# Patient Record
Sex: Male | Born: 1940 | Race: White | Hispanic: No | Marital: Married | Smoking: Former smoker
Health system: Southern US, Community
[De-identification: ages and names within clinical notes are randomized; demographics above are authoritative.]

## PROBLEM LIST (undated history)

## (undated) DIAGNOSIS — Z9079 Acquired absence of other genital organ(s): Secondary | ICD-10-CM

## (undated) DIAGNOSIS — Z9889 Other specified postprocedural states: Secondary | ICD-10-CM

## (undated) DIAGNOSIS — I10 Essential (primary) hypertension: Secondary | ICD-10-CM

## (undated) DIAGNOSIS — R569 Unspecified convulsions: Secondary | ICD-10-CM

## (undated) DIAGNOSIS — C801 Malignant (primary) neoplasm, unspecified: Secondary | ICD-10-CM

---

## 1974-01-14 HISTORY — PX: COLONOSCOPY: SHX174

## 2010-01-14 HISTORY — PX: PROSTATE SURGERY: SHX751

## 2020-01-15 DEATH — deceased

## 2021-06-13 ENCOUNTER — Emergency Department (HOSPITAL_COMMUNITY): Payer: Medicare Other

## 2021-06-13 ENCOUNTER — Inpatient Hospital Stay (HOSPITAL_COMMUNITY)
Admission: EM | Admit: 2021-06-13 | Discharge: 2021-06-15 | DRG: 066 | Disposition: A | Discharge status: Home / Self Care | Payer: Medicare Other | Attending: Internal Medicine | Admitting: Internal Medicine

## 2021-06-13 ENCOUNTER — Encounter (HOSPITAL_COMMUNITY): Payer: Self-pay

## 2021-06-13 DIAGNOSIS — G40909 Epilepsy, unspecified, not intractable, without status epilepticus: Secondary | ICD-10-CM | POA: Diagnosis present

## 2021-06-13 DIAGNOSIS — R29702 NIHSS score 2: Secondary | ICD-10-CM | POA: Diagnosis present

## 2021-06-13 DIAGNOSIS — I63412 Cerebral infarction due to embolism of left middle cerebral artery: Secondary | ICD-10-CM

## 2021-06-13 DIAGNOSIS — R4781 Slurred speech: Secondary | ICD-10-CM | POA: Diagnosis present

## 2021-06-13 DIAGNOSIS — N179 Acute kidney failure, unspecified: Secondary | ICD-10-CM | POA: Diagnosis present

## 2021-06-13 DIAGNOSIS — I63132 Cerebral infarction due to embolism of left carotid artery: Secondary | ICD-10-CM | POA: Diagnosis not present

## 2021-06-13 DIAGNOSIS — Z8546 Personal history of malignant neoplasm of prostate: Secondary | ICD-10-CM

## 2021-06-13 DIAGNOSIS — Z9079 Acquired absence of other genital organ(s): Secondary | ICD-10-CM

## 2021-06-13 DIAGNOSIS — R4701 Aphasia: Secondary | ICD-10-CM | POA: Diagnosis present

## 2021-06-13 DIAGNOSIS — I639 Cerebral infarction, unspecified: Secondary | ICD-10-CM | POA: Insufficient documentation

## 2021-06-13 DIAGNOSIS — I10 Essential (primary) hypertension: Secondary | ICD-10-CM | POA: Diagnosis present

## 2021-06-13 DIAGNOSIS — Z20822 Contact with and (suspected) exposure to covid-19: Secondary | ICD-10-CM | POA: Diagnosis present

## 2021-06-13 DIAGNOSIS — R1310 Dysphagia, unspecified: Secondary | ICD-10-CM | POA: Diagnosis present

## 2021-06-13 DIAGNOSIS — R569 Unspecified convulsions: Secondary | ICD-10-CM

## 2021-06-13 DIAGNOSIS — E785 Hyperlipidemia, unspecified: Secondary | ICD-10-CM | POA: Diagnosis present

## 2021-06-13 DIAGNOSIS — Z888 Allergy status to other drugs, medicaments and biological substances status: Secondary | ICD-10-CM

## 2021-06-13 DIAGNOSIS — Z79899 Other long term (current) drug therapy: Secondary | ICD-10-CM

## 2021-06-13 HISTORY — DX: Acquired absence of other genital organ(s): Z90.79

## 2021-06-13 HISTORY — DX: Malignant (primary) neoplasm, unspecified: C80.1

## 2021-06-13 HISTORY — DX: Other specified postprocedural states: Z98.890

## 2021-06-13 HISTORY — DX: Essential (primary) hypertension: I10

## 2021-06-13 HISTORY — DX: Unspecified convulsions: R56.9

## 2021-06-13 LAB — COMPREHENSIVE METABOLIC PANEL
ALT: 15 U/L (ref 0–44)
AST: 20 U/L (ref 15–41)
Albumin: 3.6 g/dL (ref 3.5–5.0)
Alkaline Phosphatase: 63 U/L (ref 38–126)
Anion gap: 8 (ref 5–15)
BUN: 20 mg/dL (ref 8–23)
CO2: 22 mmol/L (ref 22–32)
Calcium: 8.6 mg/dL — ABNORMAL LOW (ref 8.9–10.3)
Chloride: 105 mmol/L (ref 98–111)
Creatinine, Ser: 1.39 mg/dL — ABNORMAL HIGH (ref 0.61–1.24)
GFR, Estimated: 51 mL/min — ABNORMAL LOW (ref >60)
Glucose, Bld: 108 mg/dL — ABNORMAL HIGH (ref 70–99)
Potassium: 4.2 mmol/L (ref 3.5–5.1)
Sodium: 135 mmol/L (ref 135–145)
Total Bilirubin: 0.4 mg/dL (ref 0.3–1.2)
Total Protein: 6.6 g/dL (ref 6.5–8.1)

## 2021-06-13 LAB — I-STAT CHEM 8, ED
BUN: 24 mg/dL — ABNORMAL HIGH (ref 8–23)
Calcium, Ion: 1.01 mmol/L — ABNORMAL LOW (ref 1.15–1.40)
Chloride: 105 mmol/L (ref 98–111)
Creatinine, Ser: 1.3 mg/dL — ABNORMAL HIGH (ref 0.61–1.24)
Glucose, Bld: 101 mg/dL — ABNORMAL HIGH (ref 70–99)
HCT: 36 % — ABNORMAL LOW (ref 39.0–52.0)
Hemoglobin: 12.2 g/dL — ABNORMAL LOW (ref 13.0–17.0)
Potassium: 4.1 mmol/L (ref 3.5–5.1)
Sodium: 136 mmol/L (ref 135–145)
TCO2: 22 mmol/L (ref 22–32)

## 2021-06-13 LAB — CBC
HCT: 37.4 % — ABNORMAL LOW (ref 39.0–52.0)
Hemoglobin: 12.1 g/dL — ABNORMAL LOW (ref 13.0–17.0)
MCH: 28.9 pg (ref 26.0–34.0)
MCHC: 32.4 g/dL (ref 30.0–36.0)
MCV: 89.5 fL (ref 80.0–100.0)
Platelets: 212 10*3/uL (ref 150–400)
RBC: 4.18 MIL/uL — ABNORMAL LOW (ref 4.22–5.81)
RDW: 14.6 % (ref 11.5–15.5)
WBC: 7.7 10*3/uL (ref 4.0–10.5)
nRBC: 0 % (ref 0.0–0.2)

## 2021-06-13 LAB — URINALYSIS, ROUTINE W REFLEX MICROSCOPIC
Bilirubin Urine: NEGATIVE
Glucose, UA: NEGATIVE mg/dL
Ketones, ur: NEGATIVE mg/dL
Leukocytes,Ua: NEGATIVE
Nitrite: NEGATIVE
Protein, ur: NEGATIVE mg/dL
Specific Gravity, Urine: 1.02 (ref 1.005–1.030)
pH: 6 (ref 5.0–8.0)

## 2021-06-13 LAB — RAPID URINE DRUG SCREEN, HOSP PERFORMED
Amphetamines: NOT DETECTED
Barbiturates: NOT DETECTED
Benzodiazepines: NOT DETECTED
Cocaine: NOT DETECTED
Opiates: NOT DETECTED
Tetrahydrocannabinol: NOT DETECTED

## 2021-06-13 LAB — HEMOGLOBIN A1C
Hgb A1c MFr Bld: 5.9 % — ABNORMAL HIGH (ref 4.8–5.6)
Mean Plasma Glucose: 122.63 mg/dL

## 2021-06-13 LAB — DIFFERENTIAL
Abs Immature Granulocytes: 0.03 10*3/uL (ref 0.00–0.07)
Basophils Absolute: 0.1 10*3/uL (ref 0.0–0.1)
Basophils Relative: 1 %
Eosinophils Absolute: 0.3 10*3/uL (ref 0.0–0.5)
Eosinophils Relative: 4 %
Immature Granulocytes: 0 %
Lymphocytes Relative: 31 %
Lymphs Abs: 2.4 10*3/uL (ref 0.7–4.0)
Monocytes Absolute: 0.6 10*3/uL (ref 0.1–1.0)
Monocytes Relative: 7 %
Neutro Abs: 4.3 10*3/uL (ref 1.7–7.7)
Neutrophils Relative %: 57 %

## 2021-06-13 LAB — RESP PANEL BY RT-PCR (FLU A&B, COVID) ARPGX2
Influenza A by PCR: NEGATIVE
Influenza B by PCR: NEGATIVE
SARS Coronavirus 2 by RT PCR: NEGATIVE

## 2021-06-13 LAB — APTT: aPTT: 33 seconds (ref 24–36)

## 2021-06-13 LAB — CBG MONITORING, ED: Glucose-Capillary: 106 mg/dL — ABNORMAL HIGH (ref 70–99)

## 2021-06-13 LAB — PROTIME-INR
INR: 1 (ref 0.8–1.2)
Prothrombin Time: 13.3 seconds (ref 11.4–15.2)

## 2021-06-13 MED ORDER — ATORVASTATIN CALCIUM 40 MG PO TABS: 40.0000 mg | ORAL_TABLET | Freq: Every day | ORAL | Status: DC

## 2021-06-13 MED ORDER — SODIUM CHLORIDE 0.9 % IV BOLUS
500.0000 mL | Freq: Once | INTRAVENOUS | Status: AC
  Administered 2021-06-13: 500 mL via INTRAVENOUS

## 2021-06-13 MED ORDER — LORAZEPAM 0.5 MG PO TABS: 0.5000 mg | ORAL_TABLET | Freq: Four times a day (QID) | ORAL | Status: DC | PRN

## 2021-06-13 MED ORDER — ACETAMINOPHEN 160 MG/5ML PO SOLN: 650.0000 mg | ORAL | Status: DC | PRN

## 2021-06-13 MED ORDER — HYDRALAZINE HCL 25 MG PO TABS
25.0000 mg | ORAL_TABLET | Freq: Four times a day (QID) | ORAL | Status: DC | PRN
  Administered 2021-06-13: 25 mg via ORAL
  Filled 2021-06-13: qty 1

## 2021-06-13 MED ORDER — ASPIRIN 81 MG PO TBEC
81.0000 mg | DELAYED_RELEASE_TABLET | Freq: Every day | ORAL | Status: DC
  Administered 2021-06-13: 81 mg via ORAL
  Filled 2021-06-13: qty 1

## 2021-06-13 MED ORDER — ENOXAPARIN SODIUM 40 MG/0.4ML IJ SOSY
40.0000 mg | PREFILLED_SYRINGE | INTRAMUSCULAR | Status: DC
  Administered 2021-06-13 – 2021-06-14 (×2): 40 mg via SUBCUTANEOUS
  Filled 2021-06-13 (×2): qty 0.4

## 2021-06-13 MED ORDER — ACETAMINOPHEN 650 MG RE SUPP: 650.0000 mg | RECTAL | Status: DC | PRN

## 2021-06-13 MED ORDER — LEVETIRACETAM 750 MG PO TABS
750.0000 mg | ORAL_TABLET | Freq: Two times a day (BID) | ORAL | Status: DC
  Administered 2021-06-13 – 2021-06-15 (×4): 750 mg via ORAL
  Filled 2021-06-13 (×5): qty 1

## 2021-06-13 MED ORDER — HYDRALAZINE HCL 20 MG/ML IJ SOLN
10.0000 mg | INTRAMUSCULAR | Status: AC
  Administered 2021-06-13: 10 mg via INTRAVENOUS
  Filled 2021-06-13: qty 1

## 2021-06-13 MED ORDER — ACETAMINOPHEN 325 MG PO TABS: 650.0000 mg | ORAL_TABLET | ORAL | Status: DC | PRN

## 2021-06-13 MED ORDER — LORAZEPAM 1 MG PO TABS
1.0000 mg | ORAL_TABLET | Freq: Once | ORAL | Status: AC | PRN
  Administered 2021-06-13: 1 mg via ORAL
  Filled 2021-06-13: qty 1

## 2021-06-13 MED ORDER — IOHEXOL 350 MG/ML SOLN
100.0000 mL | Freq: Once | INTRAVENOUS | Status: AC | PRN
  Administered 2021-06-13: 100 mL via INTRAVENOUS

## 2021-06-13 MED ORDER — SODIUM CHLORIDE 0.9 % IV SOLN
100.0000 mL/h | INTRAVENOUS | Status: DC
  Administered 2021-06-13 – 2021-06-15 (×4): 100 mL/h via INTRAVENOUS

## 2021-06-13 MED ORDER — STROKE: EARLY STAGES OF RECOVERY BOOK
Freq: Once | Status: AC
  Filled 2021-06-13: qty 1

## 2021-06-13 MED ORDER — SENNOSIDES-DOCUSATE SODIUM 8.6-50 MG PO TABS: 1.0000 | ORAL_TABLET | Freq: Every evening | ORAL | Status: DC | PRN

## 2021-06-13 MED ORDER — PANTOPRAZOLE SODIUM 40 MG PO TBEC
40.0000 mg | DELAYED_RELEASE_TABLET | Freq: Every day | ORAL | Status: DC
  Administered 2021-06-13 – 2021-06-15 (×3): 40 mg via ORAL
  Filled 2021-06-13 (×3): qty 1

## 2021-06-13 NOTE — ED Triage Notes (Signed)
LKN last night at 2030-2130. Pt is here for slurred speech, unable to state his name, birthday. Able to follow commands. No weakness noted to all four extremities. No hx of stroke. Not on blood thinners.

## 2021-06-13 NOTE — ED Notes (Signed)
Dr.Lockwood notified of pt bp. Pt reports he took his morning bp meds. Will continue to monitor.

## 2021-06-13 NOTE — ED Notes (Signed)
Wife reports pt LKN at 2030 last night when pt was unable to change the channel of tv and was letting his wife do it. This morning, pt unable to state his name, birthday or unable to identify things at home. Wife states pt speech is different and slurred with aphasia noted. Initial NIHS is 4. Pt is able to follow commands. Not on blood thinners. No hx of stroke but pt has hx of seizures noted. Cardiac monitoring in place.

## 2021-06-13 NOTE — ED Provider Notes (Signed)
[facility] EMERGENCY DEPARTMENT Provider Note   CSN: [id number] Arrival date & time: 06/13/21  1532     History  Chief Complaint  Patient presents with   Aphasia    Jay Howard is a 81 y.o. male.  HPI Patient presents with his wife who provides much of the history as the patient has gross evidence for expressive aphasia, level 5 caveat. Patient has history of hypertension, no history of stroke, A-fib, CAD.  He was in his usual state of health until about 20 hours ago when he developed expressive aphasia.  This has persisted since that time without development of new other weakness or complaints including pain, dyspnea.  No clear alleviating or exacerbating factors.    Home Medications Prior to Admission medications   Not on File      Allergies    Amlodipine, Hydrochlorothiazide, Lisinopril, and Quinapril    Review of Systems   Review of Systems  All other systems reviewed and are negative.  Physical Exam Updated Vital Signs BP (!) 162/95   Pulse 65   Temp 98 F (36.7 C) (Oral)   Resp (!) 22   Ht '5\' 7"'$  (1.702 m)   Wt 77.1 kg   SpO2 95%   BMI 26.63 kg/m  Physical Exam Vitals and nursing note reviewed.  Constitutional:      General: He is not in acute distress.    Appearance: He is well-developed.  HENT:     Head: Normocephalic and atraumatic.  Eyes:     Conjunctiva/sclera: Conjunctivae normal.  Cardiovascular:     Rate and Rhythm: Normal rate and regular rhythm.  Pulmonary:     Effort: Pulmonary effort is normal. No respiratory distress.     Breath sounds: No stridor.  Abdominal:     General: There is no distension.  Skin:    General: Skin is warm and dry.  Neurological:     Mental Status: He is alert and oriented to person, place, and time.     Cranial Nerves: Dysarthria present. No facial asymmetry.     Motor: No weakness, tremor, atrophy or abnormal muscle tone.     Coordination: Coordination normal.    ED Results /  Procedures / Treatments   Labs (all labs ordered are listed, but only abnormal results are displayed) Labs Reviewed  CBC - Abnormal; Notable for the following components:      Result Value   RBC 4.18 (*)    Hemoglobin 12.1 (*)    HCT 37.4 (*)    All other components within normal limits  CBG MONITORING, ED - Abnormal; Notable for the following components:   Glucose-Capillary 106 (*)    All other components within normal limits  I-STAT CHEM 8, ED - Abnormal; Notable for the following components:   BUN 24 (*)    Creatinine, Ser 1.30 (*)    Glucose, Bld 101 (*)    Calcium, Ion 1.01 (*)    Hemoglobin 12.2 (*)    HCT 36.0 (*)    All other components within normal limits  RESP PANEL BY RT-PCR (FLU A&B, COVID) ARPGX2  PROTIME-INR  APTT  DIFFERENTIAL  COMPREHENSIVE METABOLIC PANEL  RAPID URINE DRUG SCREEN, HOSP PERFORMED  URINALYSIS, ROUTINE W REFLEX MICROSCOPIC    EKG EKG Interpretation  Date/Time:  Wednesday Jun 13 2021 15:34:39 EDT Ventricular Rate:  71 PR Interval:  242 QRS Duration: 97 QT Interval:  413 QTC Calculation: 449 R Axis:   79 Text Interpretation: Sinus rhythm Prolonged  PR interval Abnormal ECG Confirmed by Carmin Muskrat [id number]) on 06/13/2021 4:13:06 PM  Radiology No results found.  Procedures Procedures    Medications Ordered in ED Medications  sodium chloride 0.9 % bolus 500 mL (500 mLs Intravenous New Bag/Given 06/13/21 1654)    Followed by  0.9 %  sodium chloride infusion (100 mL/hr Intravenous New Bag/Given 06/13/21 1654)  iohexol (OMNIPAQUE) 350 MG/ML injection 100 mL (100 mLs Intravenous Contrast Given 06/13/21 1650)    ED Course/ Medical Decision Making/ A&P This patient with a Hx of hypertension presents to the ED for concern of new expressive aphasia, this involves an extensive number of treatment options, and is a complaint that carries with it a high risk of complications and morbidity.    The differential diagnosis includes stroke,  hypertensive emergency, stenosis   Social Determinants of Health:  Age  Additional history obtained:  Additional history and/or information obtained from wife, notable for details of history as above   After the initial evaluation, orders, including: Labs CT CT perfusion CT a head and neck after discussion with neurology were initiated.  Following my initial exam given the passage of almost 24 hours the patient not a candidate for TNK, but given concern for new focal neurologic deficit I discussed this case with our neurology colleagues and the patient was designated as a code stroke.   Patient placed on Cardiac and Pulse-Oximetry Monitors. The patient was maintained on a cardiac monitor.  The cardiac monitored showed an rhythm of 70 sinus normal The patient was also maintained on pulse oximetry. The readings were typically 100% room air normal   On repeat evaluation of the patient stayed the same  Lab Tests:  I personally interpreted labs.  The pertinent results include: Mild anemia, mild hyperglycemia  Imaging Studies ordered:  I independently visualized and interpreted imaging which showed likely calcific plaque, M2, left, consistent with patient's neuro deficit.  I reviewed these images with the neurologist at length. I agree with the radiologist interpretation  Consultations Obtained:  I requested consultation with the neurology and internal medicine,  and discussed lab and imaging findings as well as pertinent plan - they recommend: Admission for stroke evaluation.  Dispostion / Final MDM:  After consideration of the diagnostic results and the patient's response to treatment, this adult male with history of hypertension presents with expressive aphasia for almost 24 hours.  Patient is awake, alert, otherwise has a reassuring neurologic exam, is in no distress, has mild hypertension, but is otherwise hemodynamically unremarkable. Given concern for new neurodeficit the  patient's condition, he is designated as a code stroke, and expeditious evaluation demonstrated CT findings concerning for acute occlusion left M2, which was discussed at length with our neurology colleagues, family aware, patient required admission to our internal medicine colleagues.  Final Clinical Impression(s) / ED Diagnoses Final diagnoses:  Acute CVA (cerebrovascular accident) [facility])     Carmin Muskrat, MD 06/13/21 [phone]

## 2021-06-13 NOTE — ED Notes (Signed)
Per neuro, neuro checks q1hour until 2030 tonight. Orders in.

## 2021-06-13 NOTE — Consult Note (Signed)
Neurology Consultation  Reason for Consult: CODE STROKE - aphasia Referring Physician: Dr. Carmin Muskrat  CC: Aphasia  History is obtained from: Chart, EDP, wife  HPI: Jay Howard is a 81 y.o. 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  Last known well was somewhere around 8 or 8:30 PM last night when he was watching TV and suddenly was noted that he was not able to operate the remote control and had difficulty with communication.  Not sure why the family waited so long to get him into the hospital.  Brought in by EMS-code stroke was not activated because it was isolated aphasia and no other features that would prompt activation of code stroke based on Van scale EDP evaluated the patient and contacted me and I recommended we activate a code stroke to get prompt imaging. CT head with established looking hypodensity in the left frontal lobe consistent with an acute infarction with no evidence of bleed.  Calcific embolus in the left sylvian fissure.  Emergent CT angiography and perfusion studies were performed that showed a calcific embolus in the left M2 segment superior division. Outside the window for TNKase Low NIH stroke scale as well as distal location of the clot with a calcific clot making endovascular thrombectomy risky for low NIH stroke scale.   LKW: 8:30 PM on 06/12/2021 tpa given?: no, outside the window Premorbid modified Rankin scale (mRS): 1   ROS: Unable to perform due to aphasia  Past Medical History:  Diagnosis Date   Cancer [facility])    H/O hemorrhoidectomy    H/O prostatectomy    Hypertension    Seizures ([place])    No family history on file.  Social History:   reports that he has never smoked. He has never used smokeless tobacco. He reports current alcohol use. He reports that he does not use drugs.  Medications  Current  Facility-Administered Medications:    sodium chloride 0.9 % bolus 500 mL, 500 mL, Intravenous, Once **FOLLOWED BY** 0.9 %  sodium chloride infusion, 100 mL/hr, Intravenous, Continuous, Carmin Muskrat, MD No current outpatient medications on file.  Exam: Current vital signs: BP (!) 187/78 (BP Location: Right Arm)   Pulse 66   Temp 98 F (36.7 C) (Oral)   Resp 15   Ht '5\' 7"'$  (1.702 m)   Wt 77.1 kg   SpO2 99%   BMI 26.63 kg/m  Vital signs in last 24 hours: Temp:  [98 F (36.7 C)] 98 F (36.7 C) (05/31 1537) Pulse Rate:  [66] 66 (05/31 1537) Resp:  [15] 15 (05/31 1537) BP: (187)/(78) 187/78 (05/31 1537) SpO2:  [99 %] 99 % (05/31 1537) Weight:  [77.1 kg] 77.1 kg (05/31 1537) General: Awake alert in no distress HEENT: Normocephalic atraumatic Lungs: Clear Cardiovascular: Regular rhythm Abdomen nondistended nontender Extremities warm well perfused Neurological exam He is awake alert. He was not able to tell me his name. He is not able to tell me where he is He was not able to tell me the date or month. Upon asking him his name he kept saying sentences which made no sense in relation to the question. His speech was fluent.  He was able to follow commands.  He had repetition somewhat intact. Cranial nerves: Pupils are equal round reactive to light, extraocular movements intact, visual fields appear full, face appears symmetric, tongue and palate midline. Motor examination with  no drift in any of the 4 extremities Sensation intact to touch Coordination with no dysmetria NIH stroke scale-2 for aphasia   Labs I have reviewed labs in epic and the results pertinent to this consultation are:  CBC    Component Value Date/Time   WBC 7.7 06/13/2021 1555   RBC 4.18 (L) 06/13/2021 1555   HGB 12.2 (L) 06/13/2021 1628   HCT 36.0 (L) 06/13/2021 1628   PLT 212 06/13/2021 1555   MCV 89.5 06/13/2021 1555   MCH 28.9 06/13/2021 1555   MCHC 32.4 06/13/2021 1555   RDW 14.6 06/13/2021  1555   LYMPHSABS 2.4 06/13/2021 1555   MONOABS 0.6 06/13/2021 1555   EOSABS 0.3 06/13/2021 1555   BASOSABS 0.1 06/13/2021 1555    CMP     Component Value Date/Time   NA 136 06/13/2021 1628   K 4.1 06/13/2021 1628   CL 105 06/13/2021 1628   CO2 22 06/13/2021 1555   GLUCOSE 101 (H) 06/13/2021 1628   BUN 24 (H) 06/13/2021 1628   CREATININE 1.30 (H) 06/13/2021 1628   CALCIUM 8.6 (L) 06/13/2021 1555   PROT 6.6 06/13/2021 1555   ALBUMIN 3.6 06/13/2021 1555   AST 20 06/13/2021 1555   ALT 15 06/13/2021 1555   ALKPHOS 63 06/13/2021 1555   BILITOT 0.4 06/13/2021 1555   GFRNONAA 51 (L) 06/13/2021 1555   Imaging I have reviewed the images obtained:  CT head with established looking hypodensity in the left frontal lobe consistent with an acute infarction with no evidence of bleed.  Calcific embolus in the left sylvian fissure.  Emergent CT angiography and perfusion studies were performed that showed a calcific embolus in the left M2 segment superior division. CT perfusion with no core and 3 cc penumbra likely underestimation versus inaccurate estimation  Detailed CT angiography radiology report pasted below: IMPRESSION: 1. 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 ASPECTS score equals 9. 2. No infarct core identified on the perfusion software. 3 cc ischemic penumbra is identified in the left MCA distribution, possibly underestimated given appearance on the noncontrast CT. 3. 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 4. Calcified plaque at the bilateral carotid bifurcations resulting in approximately 40-50% stenosis on the left and no hemodynamically significant stenosis on the right. 5. 2.7 cm left thyroid nodule. Recommend further evaluation with thyroid ultrasound as clinically indicated given patient's age, if not already  performed elsewhere. 6. Debris in the trachea. Correlate with any signs or symptoms of aspiration.  Assessment:  81 year old with above history presenting for evaluation of isolated aphasia.  Code stroke activated by the ED provider upon his evaluation.  CT head with an established hypodensity in the left frontal area.  CT angiography of the head and neck with calcific plaque in the left M2. Not a candidate for TNKase being out of time window. Although symptoms disabling in spite of low NIH stroke scale, discussed with the neuroradiologist and due to the distal location and the calcific nature of the plaque-at this time the risks of the procedure are higher than any benefit that can be achieved, hence decision made not to pursue thrombectomy. Plan discussed with the EDP and family Should he worsen, would reconsider EVT-till 8:30 PM today which will be 24 hours from last known well.   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  If he worsens, please recall neurology for consideration of endovascular thrombectomy emergently. Telemetry MRI brain without contrast A1c Lipid panel 2D echo Permissive hypertension-allow for systolic blood pressure to be as high as 220 and treat only if higher than that on a as needed basis. Eventual blood pressure goal normotension on discharge but permissive hypertension should be allowed for the next 48 to 72 hours. PT OT Speech therapy Concern for aspiration due to debris seen in trachea on CTA-bedside swallow eval.  N.p.o. until cleared by bedside swallow evaluation of formal speech therapy evaluation. High intensity statin atorvastatin 80 mg now and daily Outpatient list of medications not available but I would recommend starting dual antiplatelets aspirin 75+ Plavix 81 mg now and daily. Stroke team to  follow Plan discussed with Dr. Vanita Panda and patient's wife at bedside as well as with the patient who acknowledged understanding by saying yes to the appropriate questions.  -- Amie Portland, MD Neurologist [facility] Pager: [phone]

## 2021-06-13 NOTE — H&P (Signed)
History and Physical    Jay Howard JYN:[id number] DOB: September 30, 1940 DOA: 06/13/2021  PCP: Burman Freestone, MD (Confirm with patient/family/NH records and if not entered, this has to be entered at [facility] point of entry) Patient coming from: Home  I have personally briefly reviewed patient's old medical records in [place]  Chief Complaint: Trouble speaking  HPI: Jay Howard is a 81 y.o. male with medical history significant of HTN, seizure disorder, prostate CA status post prostatectomy, HLD came with trouble speaking x1 day.  Symptoms started yesterday evening about 2030, patient started to have slurred speech, which he described as a he had no trouble understanding family's conversation but " difficult to form sentences in his minds".  And he denied any numbness weakness in the limbs.  He went to bed, but this morning he woke up and found his speech problem remained the same.  Again he denies any numbness or weakness of the limbs.  Wife reported that patient blood pressure has been fairly controlled, and no recent changes or dosage adjustments.  ED Course: Blood pressure significantly elevated SBP> 200.  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  CT angiogram occluded left superior M2 branch due to calcified embolus corresponding with acute infarct in the MCA distribution.  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  Review of Systems: As per HPI otherwise 10 point review of systems negative.    Past Medical History:  Diagnosis Date   Cancer [facility])    H/O hemorrhoidectomy    H/O prostatectomy    Hypertension    Seizures ([place])     History reviewed. No pertinent surgical history.   reports that he has never smoked. He has never used smokeless tobacco. He reports current alcohol use. He reports that he does not use drugs.  Allergies  Allergen Reactions   Amlodipine Rash and Swelling   Hydrochlorothiazide  Rash   Lisinopril Rash   Quinapril Rash    No family history on file.   Prior to Admission medications   Medication Sig Start Date End Date Taking? Authorizing Provider  acetaminophen (TYLENOL) 500 MG tablet Take 500 mg by mouth every 6 (six) hours as needed for mild pain.   Yes [provider]  atorvastatin (LIPITOR) 10 MG tablet Take 5 mg by mouth daily.   Yes [provider]  carvedilol (COREG) 12.5 MG tablet Take 12.5 mg by mouth 2 (two) times daily with a meal.   Yes [provider]  Cholecalciferol (VITAMIN D3) 25 MCG (1000 UT) CAPS Take 1 capsule by mouth daily.   Yes [provider]  hydrALAZINE (APRESOLINE) 25 MG tablet Take 25 mg by mouth in the morning and at bedtime.   Yes [provider]  levETIRAcetam (KEPPRA) 750 MG tablet Take 750 mg by mouth 2 (two) times daily.   Yes [provider]  losartan (COZAAR) 100 MG tablet Take 100 mg by mouth daily.   Yes [provider]  magnesium oxide (MAG-OX) 400 MG tablet Take 400 mg by mouth daily.   Yes [provider]  pantoprazole (PROTONIX) 40 MG tablet Take 40 mg by mouth daily.   Yes [provider]  spironolactone (ALDACTONE) 25 MG tablet Take 25 mg by mouth daily.   Yes [provider]  thiamine (VITAMIN B-1) 100 MG tablet Take 100 mg by mouth daily.   Yes [provider]    Physical Exam: Vitals:  06/13/21 1537 06/13/21 1649 06/13/21 1715 06/13/21 1805  BP: (!) 187/78 (!) 162/95 (!) 216/90 (!) 200/85  Pulse: 66 65 68 73  Resp: 15 (!) '22 16 18  '$ Temp: 98 F (36.7 C)     TempSrc: Oral     SpO2: 99% 95% 100% 100%  Weight: 77.1 kg     Height: '5\' 7"'$  (1.702 m)       Constitutional: NAD, calm, comfortable Vitals:   06/13/21 1537 06/13/21 1649 06/13/21 1715 06/13/21 1805  BP: (!) 187/78 (!) 162/95 (!) 216/90 (!) 200/85  Pulse: 66 65 68 73  Resp: 15 (!) '22 16 18  '$ Temp: 98 F (36.7 C)     TempSrc: Oral     SpO2: 99% 95% 100%  100%  Weight: 77.1 kg     Height: '5\' 7"'$  (1.702 m)      Eyes: PERRL, lids and conjunctivae normal ENMT: Mucous membranes are moist. Posterior pharynx clear of any exudate or lesions.Normal dentition.  Neck: normal, supple, no masses, no thyromegaly Respiratory: clear to auscultation bilaterally, no wheezing, no crackles. Normal respiratory effort. No accessory muscle use.  Cardiovascular: Regular rate and rhythm, no murmurs / rubs / gallops. No extremity edema. 2+ pedal pulses. No carotid bruits.  Abdomen: no tenderness, no masses palpated. No hepatosplenomegaly. Bowel sounds positive.  Musculoskeletal: no clubbing / cyanosis. No joint deformity upper and lower extremities. Good ROM, no contractures. Normal muscle tone.  Skin: no rashes, lesions, ulcers. No induration Neurologic: CN 2-12 grossly intact. Sensation intact, DTR normal. Strength 5/5 in all 4.  Psychiatric: Normal judgment and insight. Alert and oriented x 3. Normal mood.     Labs on Admission: I have personally reviewed following labs and imaging studies  CBC: Recent Labs  Lab 06/13/21 1555 06/13/21 1628  WBC 7.7  --   NEUTROABS 4.3  --   HGB 12.1* 12.2*  HCT 37.4* 36.0*  MCV 89.5  --   PLT 212  --    Basic Metabolic Panel: Recent Labs  Lab 06/13/21 1555 06/13/21 1628  NA 135 136  K 4.2 4.1  CL 105 105  CO2 22  --   GLUCOSE 108* 101*  BUN 20 24*  CREATININE 1.39* 1.30*  CALCIUM 8.6*  --    GFR: Estimated Creatinine Clearance: 41.7 mL/min (A) (by C-G formula based on SCr of 1.3 mg/dL (H)). Liver Function Tests: Recent Labs  Lab 06/13/21 1555  AST 20  ALT 15  ALKPHOS 63  BILITOT 0.4  PROT 6.6  ALBUMIN 3.6   No results for input(s): LIPASE, AMYLASE in the last 168 hours. No results for input(s): AMMONIA in the last 168 hours. Coagulation Profile: Recent Labs  Lab 06/13/21 1555  INR 1.0   Cardiac Enzymes: No results for input(s): CKTOTAL, CKMB, CKMBINDEX, TROPONINI in the last 168 hours. BNP  (last 3 results) No results for input(s): PROBNP in the last 8760 hours. HbA1C: No results for input(s): HGBA1C in the last 72 hours. CBG: Recent Labs  Lab 06/13/21 1554  GLUCAP 106*   Lipid Profile: No results for input(s): CHOL, HDL, LDLCALC, TRIG, CHOLHDL, LDLDIRECT in the last 72 hours. Thyroid Function Tests: No results for input(s): TSH, T4TOTAL, FREET4, T3FREE, THYROIDAB in the last 72 hours. Anemia Panel: No results for input(s): VITAMINB12, FOLATE, FERRITIN, TIBC, IRON, RETICCTPCT in the last 72 hours. Urine analysis: No results found for: COLORURINE, APPEARANCEUR, LABSPEC, PHURINE, GLUCOSEU, HGBUR, BILIRUBINUR, KETONESUR, PROTEINUR, UROBILINOGEN, NITRITE, LEUKOCYTESUR  Radiological Exams on Admission: CT ANGIO HEAD  W OR WO CONTRAST  Result Date: 06/13/2021 CLINICAL DATA:  Neuro deficit, stroke suspected EXAM: CT ANGIOGRAPHY HEAD AND NECK CT PERFUSION BRAIN TECHNIQUE: Multidetector CT imaging of the head and neck was performed using the standard protocol during bolus administration of intravenous contrast. Multiplanar CT image reconstructions and MIPs were obtained to evaluate the vascular anatomy. Carotid stenosis measurements (when applicable) are obtained utilizing NASCET criteria, using the distal internal carotid diameter as the denominator. Multiphase CT imaging of the brain was performed following IV bolus contrast injection. Subsequent parametric perfusion maps were calculated using RAPID software. RADIATION DOSE REDUCTION: This exam was performed according to the departmental dose-optimization program which includes automated exposure control, adjustment of the mA and/or kV according to patient size and/or use of iterative reconstruction technique. CONTRAST:  135m OMNIPAQUE IOHEXOL 350 MG/ML SOLN COMPARISON:  CT head 03/30/2018 FINDINGS: CT HEAD FINDINGS Brain: There is hypodensity with loss of gray-white differentiation in the left frontal lobe consistent with acute infarct.  There is no associated hemorrhagic transformation. There is no acute intracranial hemorrhage or extra-axial fluid collection. Background parenchymal volume is normal. The ventricles are normal in size. There is no solid mass lesion. There is no mass effect or midline shift. Vascular: There is new calcification in the left sylvian fissure consistent with a calcific embolus. The vasculature is assessed in full below. Skull: Normal. Negative for fracture or focal lesion. Sinuses/Orbits: Paranasal sinuses are clear. Bilateral lens implants are in place. The globes and orbits are otherwise unremarkable. Other: None. AS[facility]S [facility]) - Ganglionic level infarction (caudate, lentiform nuclei, internal capsule, insula, M1-M3 cortex): 7 - Supraganglionic infarction (M4-M6 cortex): 2 Total score (0-10 with 10 being normal): 9 Review of the MIP images confirms the above findings CTA NECK FINDINGS Aortic arch: There is calcified atherosclerotic plaque in the imaged aortic arch. The origins of the major branch vessels are patent. There is mixed plaque in the proximal left subclavian artery resulting in mild stenosis prior to the origin of the left vertebral artery. The subclavian arteries are otherwise patent to the level imaged. Right carotid system: The right common carotid artery is patent. There is calcified plaque at the bifurcation and proximal right internal carotid artery resulting in less than 50% stenosis. The distal right internal carotid artery is patent. The right external carotid artery is patent. There is no evidence of dissection or aneurysm. Left carotid system: The left common carotid artery is patent. There is predominantly calcified plaque in the proximal left internal carotid artery resulting in up to approximately 40-50% stenosis. The distal left internal carotid artery is patent. The left external carotid artery is patent. There is no evidence of dissection or aneurysm.  Vertebral arteries: There is severe stenosis at the origin of the left vertebral artery. The left vertebral artery is otherwise patent throughout the neck. There is moderate stenosis of the right V1 segment after the origin. The remainder of the right vertebral artery is patent. Skeleton: There is multilevel degenerative change of the cervical spine. There is no acute osseous abnormality or suspicious osseous lesion. There is no visible canal hematoma. Other neck: There is a 2.7 cm left thyroid nodule. The soft tissues of the neck are otherwise unremarkable. Upper chest: The imaged lung apices are clear. There is debris in the upper trachea. Review of the MIP images confirms the above findings CTA HEAD FINDINGS Anterior circulation: There is calcified plaque in the intracranial ICAs resulting in up to mild stenosis bilaterally. The  left M1 segment is patent. There is occlusion of a superior left M2 branch in the sylvian fissure at the site of calcification seen on the initial noncontrast head CT (5-40, 12-22). The right MCA is patent. The bilateral MCAs are patent. There is no aneurysm or AVM. Posterior circulation: The bilateral V4 segments are patent. PICA is identified bilaterally. The basilar artery is patent. The bilateral PCAs are patent. The posterior communicating arteries are not definitely seen. There is no aneurysm or AVM. Venous sinuses: Patent. Anatomic variants: None. Review of the MIP images confirms the above findings CT Brain Perfusion Findings: ASPECTS: 9 CBF (<30%) Volume: 39m Perfusion (Tmax>6.0s) volume: 328mMismatch Volume: 11m7mnfarction Location:No infarct core is identified by the perfusion software. The 3 cc penumbra is in the left MCA distribution. IMPRESSION: 1. 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 ASPECTS score equals 9. 2. No infarct core identified on the perfusion software. 3 cc ischemic penumbra is  identified in the left MCA distribution, possibly underestimated given appearance on the noncontrast CT. 3. 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 4. Calcified plaque at the bilateral carotid bifurcations resulting in approximately 40-50% stenosis on the left and no hemodynamically significant stenosis on the right. 5. 2.7 cm left thyroid nodule. Recommend further evaluation with thyroid ultrasound as clinically indicated given patient's age, if not already performed elsewhere. 6. Debris in the trachea. Correlate with any signs or symptoms of aspiration. These results were called by telephone at the time of interpretation on 06/13/2021 at 4:40 pm to provider Dr AroRory Percyho verbally acknowledged these results. Electronically Signed   By: PetValetta MoleD.   On: 06/13/2021 17:02   CT HEAD WO CONTRAST  Result Date: 06/13/2021 CLINICAL DATA:  Neuro deficit, stroke suspected EXAM: CT ANGIOGRAPHY HEAD AND NECK CT PERFUSION BRAIN TECHNIQUE: Multidetector CT imaging of the head and neck was performed using the standard protocol during bolus administration of intravenous contrast. Multiplanar CT image reconstructions and MIPs were obtained to evaluate the vascular anatomy. Carotid stenosis measurements (when applicable) are obtained utilizing NASCET criteria, using the distal internal carotid diameter as the denominator. Multiphase CT imaging of the brain was performed following IV bolus contrast injection. Subsequent parametric perfusion maps were calculated using RAPID software. RADIATION DOSE REDUCTION: This exam was performed according to the departmental dose-optimization program which includes automated exposure control, adjustment of the mA and/or kV according to patient size and/or use of iterative reconstruction technique. CONTRAST:  100m47mNIPAQUE IOHEXOL 350 MG/ML SOLN COMPARISON:  CT head 03/30/2018 FINDINGS: CT HEAD FINDINGS  Brain: There is hypodensity with loss of gray-white differentiation in the left frontal lobe consistent with acute infarct. There is no associated hemorrhagic transformation. There is no acute intracranial hemorrhage or extra-axial fluid collection. Background parenchymal volume is normal. The ventricles are normal in size. There is no solid mass lesion. There is no mass effect or midline shift. Vascular: There is new calcification in the left sylvian fissure consistent with a calcific embolus. The vasculature is assessed in full below. Skull: Normal. Negative for fracture or focal lesion. Sinuses/Orbits: Paranasal sinuses are clear. Bilateral lens implants are in place. The globes and orbits are otherwise unremarkable. Other: None. ASPECTS ([facility] CT Score) - Ganglionic level infarction (caudate, lentiform nuclei, internal capsule, insula, M1-M3 cortex): 7 - Supraganglionic infarction (M4-M6 cortex): 2 Total score (  0-10 with 10 being normal): 9 Review of the MIP images confirms the above findings CTA NECK FINDINGS Aortic arch: There is calcified atherosclerotic plaque in the imaged aortic arch. The origins of the major branch vessels are patent. There is mixed plaque in the proximal left subclavian artery resulting in mild stenosis prior to the origin of the left vertebral artery. The subclavian arteries are otherwise patent to the level imaged. Right carotid system: The right common carotid artery is patent. There is calcified plaque at the bifurcation and proximal right internal carotid artery resulting in less than 50% stenosis. The distal right internal carotid artery is patent. The right external carotid artery is patent. There is no evidence of dissection or aneurysm. Left carotid system: The left common carotid artery is patent. There is predominantly calcified plaque in the proximal left internal carotid artery resulting in up to approximately 40-50% stenosis. The distal left internal  carotid artery is patent. The left external carotid artery is patent. There is no evidence of dissection or aneurysm. Vertebral arteries: There is severe stenosis at the origin of the left vertebral artery. The left vertebral artery is otherwise patent throughout the neck. There is moderate stenosis of the right V1 segment after the origin. The remainder of the right vertebral artery is patent. Skeleton: There is multilevel degenerative change of the cervical spine. There is no acute osseous abnormality or suspicious osseous lesion. There is no visible canal hematoma. Other neck: There is a 2.7 cm left thyroid nodule. The soft tissues of the neck are otherwise unremarkable. Upper chest: The imaged lung apices are clear. There is debris in the upper trachea. Review of the MIP images confirms the above findings CTA HEAD FINDINGS Anterior circulation: There is calcified plaque in the intracranial ICAs resulting in up to mild stenosis bilaterally. The left M1 segment is patent. There is occlusion of a superior left M2 branch in the sylvian fissure at the site of calcification seen on the initial noncontrast head CT (5-40, 12-22). The right MCA is patent. The bilateral MCAs are patent. There is no aneurysm or AVM. Posterior circulation: The bilateral V4 segments are patent. PICA is identified bilaterally. The basilar artery is patent. The bilateral PCAs are patent. The posterior communicating arteries are not definitely seen. There is no aneurysm or AVM. Venous sinuses: Patent. Anatomic variants: None. Review of the MIP images confirms the above findings CT Brain Perfusion Findings: ASPECTS: 9 CBF (<30%) Volume: 25m Perfusion (Tmax>6.0s) volume: 336mMismatch Volume: 59m559mnfarction Location:No infarct core is identified by the perfusion software. The 3 cc penumbra is in the left MCA distribution. IMPRESSION: 1. Occluded left superior M2 branch due to a calcific embolus with corresponding acute infarct in the MCA  distribution. ASPECTS score equals 9. 2. No infarct core identified on the perfusion software. 3 cc ischemic penumbra is identified in the left MCA distribution, possibly underestimated given appearance on the noncontrast CT. 3. 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 4. Calcified plaque at the bilateral carotid bifurcations resulting in approximately 40-50% stenosis on the left and no hemodynamically significant stenosis on the right. 5. 2.7 cm left thyroid nodule. Recommend further evaluation with thyroid ultrasound as clinically indicated given patient's age, if not already performed elsewhere. 6. Debris in the trachea. Correlate with any signs or symptoms of aspiration. These results were called by telephone at the time of interpretation on 06/13/2021 at 4:40 pm to  provider Dr Rory Percy, who verbally acknowledged these results. Electronically Signed   By: Valetta Mole M.D.   On: 06/13/2021 17:02   CT ANGIO NECK W OR WO CONTRAST  Result Date: 06/13/2021 CLINICAL DATA:  Neuro deficit, stroke suspected EXAM: CT ANGIOGRAPHY HEAD AND NECK CT PERFUSION BRAIN TECHNIQUE: Multidetector CT imaging of the head and neck was performed using the standard protocol during bolus administration of intravenous contrast. Multiplanar CT image reconstructions and MIPs were obtained to evaluate the vascular anatomy. Carotid stenosis measurements (when applicable) are obtained utilizing NASCET criteria, using the distal internal carotid diameter as the denominator. Multiphase CT imaging of the brain was performed following IV bolus contrast injection. Subsequent parametric perfusion maps were calculated using RAPID software. RADIATION DOSE REDUCTION: This exam was performed according to the departmental dose-optimization program which includes automated exposure control, adjustment of the mA and/or kV according to patient size and/or use of iterative  reconstruction technique. CONTRAST:  18m OMNIPAQUE IOHEXOL 350 MG/ML SOLN COMPARISON:  CT head 03/30/2018 FINDINGS: CT HEAD FINDINGS Brain: There is hypodensity with loss of gray-white differentiation in the left frontal lobe consistent with acute infarct. There is no associated hemorrhagic transformation. There is no acute intracranial hemorrhage or extra-axial fluid collection. Background parenchymal volume is normal. The ventricles are normal in size. There is no solid mass lesion. There is no mass effect or midline shift. Vascular: There is new calcification in the left sylvian fissure consistent with a calcific embolus. The vasculature is assessed in full below. Skull: Normal. Negative for fracture or focal lesion. Sinuses/Orbits: Paranasal sinuses are clear. Bilateral lens implants are in place. The globes and orbits are otherwise unremarkable. Other: None. ASPECTS [facility]) - Ganglionic level infarction (caudate, lentiform nuclei, internal capsule, insula, M1-M3 cortex): 7 - Supraganglionic infarction (M4-M6 cortex): 2 Total score (0-10 with 10 being normal): 9 Review of the MIP images confirms the above findings CTA NECK FINDINGS Aortic arch: There is calcified atherosclerotic plaque in the imaged aortic arch. The origins of the major branch vessels are patent. There is mixed plaque in the proximal left subclavian artery resulting in mild stenosis prior to the origin of the left vertebral artery. The subclavian arteries are otherwise patent to the level imaged. Right carotid system: The right common carotid artery is patent. There is calcified plaque at the bifurcation and proximal right internal carotid artery resulting in less than 50% stenosis. The distal right internal carotid artery is patent. The right external carotid artery is patent. There is no evidence of dissection or aneurysm. Left carotid system: The left common carotid artery is patent. There is predominantly  calcified plaque in the proximal left internal carotid artery resulting in up to approximately 40-50% stenosis. The distal left internal carotid artery is patent. The left external carotid artery is patent. There is no evidence of dissection or aneurysm. Vertebral arteries: There is severe stenosis at the origin of the left vertebral artery. The left vertebral artery is otherwise patent throughout the neck. There is moderate stenosis of the right V1 segment after the origin. The remainder of the right vertebral artery is patent. Skeleton: There is multilevel degenerative change of the cervical spine. There is no acute osseous abnormality or suspicious osseous lesion. There is no visible canal hematoma. Other neck: There is a 2.7 cm left thyroid nodule. The soft tissues of the neck are otherwise unremarkable. Upper chest: The imaged lung apices are clear. There is debris in the upper trachea. Review of the  MIP images confirms the above findings CTA HEAD FINDINGS Anterior circulation: There is calcified plaque in the intracranial ICAs resulting in up to mild stenosis bilaterally. The left M1 segment is patent. There is occlusion of a superior left M2 branch in the sylvian fissure at the site of calcification seen on the initial noncontrast head CT (5-40, 12-22). The right MCA is patent. The bilateral MCAs are patent. There is no aneurysm or AVM. Posterior circulation: The bilateral V4 segments are patent. PICA is identified bilaterally. The basilar artery is patent. The bilateral PCAs are patent. The posterior communicating arteries are not definitely seen. There is no aneurysm or AVM. Venous sinuses: Patent. Anatomic variants: None. Review of the MIP images confirms the above findings CT Brain Perfusion Findings: ASPECTS: 9 CBF (<30%) Volume: 40m Perfusion (Tmax>6.0s) volume: 332mMismatch Volume: 87m40mnfarction Location:No infarct core is identified by the perfusion software. The 3 cc penumbra is in the left MCA  distribution. IMPRESSION: 1. 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 ASPECTS score equals 9. 2. No infarct core identified on the perfusion software. 3 cc ischemic penumbra is identified in the left MCA distribution, possibly underestimated given appearance on the noncontrast CT. 3. 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 4. Calcified plaque at the bilateral carotid bifurcations resulting in approximately 40-50% stenosis on the left and no hemodynamically significant stenosis on the right. 5. 2.7 cm left thyroid nodule. Recommend further evaluation with thyroid ultrasound as clinically indicated given patient's age, if not already performed elsewhere. 6. Debris in the trachea. Correlate with any signs or symptoms of aspiration. These results were called by telephone at the time of interpretation on 06/13/2021 at 4:40 pm to provider Dr AroRory Percyho verbally acknowledged these results. Electronically Signed   By: PetValetta MoleD.   On: 06/13/2021 17:02   CT CEREBRAL PERFUSION W CONTRAST  Result Date: 06/13/2021 CLINICAL DATA:  Neuro deficit, stroke suspected EXAM: CT ANGIOGRAPHY HEAD AND NECK CT PERFUSION BRAIN TECHNIQUE: Multidetector CT imaging of the head and neck was performed using the standard protocol during bolus administration of intravenous contrast. Multiplanar CT image reconstructions and MIPs were obtained to evaluate the vascular anatomy. Carotid stenosis measurements (when applicable) are obtained utilizing NASCET criteria, using the distal internal carotid diameter as the denominator. Multiphase CT imaging of the brain was performed following IV bolus contrast injection. Subsequent parametric perfusion maps were calculated using RAPID software. RADIATION DOSE REDUCTION: This exam was performed according to the departmental dose-optimization  program which includes automated exposure control, adjustment of the mA and/or kV according to patient size and/or use of iterative reconstruction technique. CONTRAST:  100m48mNIPAQUE IOHEXOL 350 MG/ML SOLN COMPARISON:  CT head 03/30/2018 FINDINGS: CT HEAD FINDINGS Brain: There is hypodensity with loss of gray-white differentiation in the left frontal lobe consistent with acute infarct. There is no associated hemorrhagic transformation. There is no acute intracranial hemorrhage or extra-axial fluid collection. Background parenchymal volume is normal. The ventricles are normal in size. There is no solid mass lesion. There is no mass effect or midline shift. Vascular: There is new calcification in the left sylvian fissure consistent with a calcific embolus. The vasculature is assessed in full below. Skull: Normal. Negative for fracture or focal lesion. Sinuses/Orbits: Paranasal sinuses are clear. Bilateral lens implants are in place. The globes and orbits are otherwise unremarkable. Other: None.  ASPECTS [facility] Stroke Program Early CT Score) - Ganglionic level infarction (caudate, lentiform nuclei, internal capsule, insula, M1-M3 cortex): 7 - Supraganglionic infarction (M4-M6 cortex): 2 Total score (0-10 with 10 being normal): 9 Review of the MIP images confirms the above findings CTA NECK FINDINGS Aortic arch: There is calcified atherosclerotic plaque in the imaged aortic arch. The origins of the major branch vessels are patent. There is mixed plaque in the proximal left subclavian artery resulting in mild stenosis prior to the origin of the left vertebral artery. The subclavian arteries are otherwise patent to the level imaged. Right carotid system: The right common carotid artery is patent. There is calcified plaque at the bifurcation and proximal right internal carotid artery resulting in less than 50% stenosis. The distal right internal carotid artery is patent. The right external carotid artery is patent.  There is no evidence of dissection or aneurysm. Left carotid system: The left common carotid artery is patent. There is predominantly calcified plaque in the proximal left internal carotid artery resulting in up to approximately 40-50% stenosis. The distal left internal carotid artery is patent. The left external carotid artery is patent. There is no evidence of dissection or aneurysm. Vertebral arteries: There is severe stenosis at the origin of the left vertebral artery. The left vertebral artery is otherwise patent throughout the neck. There is moderate stenosis of the right V1 segment after the origin. The remainder of the right vertebral artery is patent. Skeleton: There is multilevel degenerative change of the cervical spine. There is no acute osseous abnormality or suspicious osseous lesion. There is no visible canal hematoma. Other neck: There is a 2.7 cm left thyroid nodule. The soft tissues of the neck are otherwise unremarkable. Upper chest: The imaged lung apices are clear. There is debris in the upper trachea. Review of the MIP images confirms the above findings CTA HEAD FINDINGS Anterior circulation: There is calcified plaque in the intracranial ICAs resulting in up to mild stenosis bilaterally. The left M1 segment is patent. There is occlusion of a superior left M2 branch in the sylvian fissure at the site of calcification seen on the initial noncontrast head CT (5-40, 12-22). The right MCA is patent. The bilateral MCAs are patent. There is no aneurysm or AVM. Posterior circulation: The bilateral V4 segments are patent. PICA is identified bilaterally. The basilar artery is patent. The bilateral PCAs are patent. The posterior communicating arteries are not definitely seen. There is no aneurysm or AVM. Venous sinuses: Patent. Anatomic variants: None. Review of the MIP images confirms the above findings CT Brain Perfusion Findings: ASPECTS: 9 CBF (<30%) Volume: 372m Perfusion (Tmax>6.0s) volume: 356m Mismatch Volume: 72m94mnfarction Location:No infarct core is identified by the perfusion software. The 3 cc penumbra is in the left MCA distribution. IMPRESSION: 1. 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 ASPECTS score equals 9. 2. No infarct core identified on the perfusion software. 3 cc ischemic penumbra is identified in the left MCA distribution, possibly underestimated given appearance on the noncontrast CT. 3. 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 4. Calcified plaque at the bilateral carotid bifurcations resulting in approximately 40-50% stenosis on the left and no hemodynamically significant stenosis on the right. 5. 2.7 cm left thyroid nodule. Recommend further evaluation with thyroid ultrasound as clinically indicated given patient's age, if not already performed elsewhere. 6. Debris  in the trachea. Correlate with any signs or symptoms of aspiration. These results were called by telephone at the time of interpretation on 06/13/2021 at 4:40 pm to provider Dr Rory Percy, who verbally acknowledged these results. Electronically Signed   By: Valetta Mole M.D.   On: 06/13/2021 17:02    EKG: Independently reviewed.  Sinus rhythm, no acute ST changes.  Assessment/Plan Principal Problem:   Stroke (cerebrum) (HCC) Active Problems:   HTN (hypertension), malignant  (please populate well all problems here in Problem List. (For example, if patient is on BP meds at home and you resume or decide to hold them, it is a problem that needs to be her. Same for CAD, COPD, HLD and so on)   Acute expressive dysphagia -Secondary to left MCA stroke, according to CT angiogram appears to be a calcified embolus blocking the left M2 branch of left MCA. No Hx of afib, and suspect the calcified embolus formed in situ?  Will discuss with neurology. -Increase statin doses  from 10 mg to 40 mg daily of atorvastatin -Patient not on aspirin at baseline, will start aspirin 81 mg daily -Other risk factor including uncontrolled hypertension, but this is questionable according to patient family. -MRI, echocardiogram  HTN, uncontrolled -Allow permissive hypertension x48 hours, as needed hydralazine for now, hold all home BP meds.  Seizure disorder -No acute concerns, continue Keppra 750 mg twice daily   DVT prophylaxis: Lovenox Code Status: Full code Family Communication: Wife at bedside Disposition Plan: Expect less than 2 midnight hospital stay Consults called: Neurology Admission status: Telemetry observation   Lequita Halt MD [facility] Pager [phone]  06/13/2021, 6:35 PM

## 2021-06-13 NOTE — Code Documentation (Signed)
Stroke Response Nurse Documentation Code Documentation  Jay Howard is a 81 y.o. male arriving to [facility]  via [facility] on 06/13/21 with past medical hx of hypertension, seizure disorder, uncomplicated alcohol dependence and chronic kidney disease stage 2. Code stroke was activated by ED.   Patient from home where he was LKW at 2030 last night and now complaining of expressive aphasia.   Stroke team met patient in CT.NIHSS 2, see documentation for details and code stroke times. Patient with Expressive aphasia  on exam. The following imaging was completed:  CT Head, CTA, and CTP. Patient is not a candidate for IV Thrombolytic due to out of window. Patient is not a candidate for IR due to no LVO.   Care Plan: Q2 assessments.   Bedside handoff with ED RN.    [name] Stroke Response RN

## 2021-06-14 ENCOUNTER — Inpatient Hospital Stay (HOSPITAL_COMMUNITY): Payer: Medicare Other

## 2021-06-14 ENCOUNTER — Observation Stay (HOSPITAL_COMMUNITY): Payer: Medicare Other

## 2021-06-14 DIAGNOSIS — I10 Essential (primary) hypertension: Secondary | ICD-10-CM

## 2021-06-14 DIAGNOSIS — I639 Cerebral infarction, unspecified: Secondary | ICD-10-CM | POA: Diagnosis present

## 2021-06-14 DIAGNOSIS — Z9079 Acquired absence of other genital organ(s): Secondary | ICD-10-CM | POA: Diagnosis not present

## 2021-06-14 DIAGNOSIS — R569 Unspecified convulsions: Secondary | ICD-10-CM | POA: Diagnosis not present

## 2021-06-14 DIAGNOSIS — R4701 Aphasia: Secondary | ICD-10-CM | POA: Diagnosis present

## 2021-06-14 DIAGNOSIS — I6522 Occlusion and stenosis of left carotid artery: Secondary | ICD-10-CM | POA: Diagnosis not present

## 2021-06-14 DIAGNOSIS — N179 Acute kidney failure, unspecified: Secondary | ICD-10-CM | POA: Diagnosis present

## 2021-06-14 DIAGNOSIS — Z8546 Personal history of malignant neoplasm of prostate: Secondary | ICD-10-CM | POA: Diagnosis not present

## 2021-06-14 DIAGNOSIS — G40909 Epilepsy, unspecified, not intractable, without status epilepticus: Secondary | ICD-10-CM | POA: Diagnosis present

## 2021-06-14 DIAGNOSIS — E7800 Pure hypercholesterolemia, unspecified: Secondary | ICD-10-CM | POA: Diagnosis not present

## 2021-06-14 DIAGNOSIS — I6389 Other cerebral infarction: Secondary | ICD-10-CM

## 2021-06-14 DIAGNOSIS — Z888 Allergy status to other drugs, medicaments and biological substances status: Secondary | ICD-10-CM | POA: Diagnosis not present

## 2021-06-14 DIAGNOSIS — Z20822 Contact with and (suspected) exposure to covid-19: Secondary | ICD-10-CM | POA: Diagnosis present

## 2021-06-14 DIAGNOSIS — I63132 Cerebral infarction due to embolism of left carotid artery: Secondary | ICD-10-CM | POA: Diagnosis present

## 2021-06-14 DIAGNOSIS — E785 Hyperlipidemia, unspecified: Secondary | ICD-10-CM | POA: Diagnosis present

## 2021-06-14 DIAGNOSIS — Z79899 Other long term (current) drug therapy: Secondary | ICD-10-CM | POA: Diagnosis not present

## 2021-06-14 DIAGNOSIS — R29702 NIHSS score 2: Secondary | ICD-10-CM | POA: Diagnosis present

## 2021-06-14 DIAGNOSIS — R4781 Slurred speech: Secondary | ICD-10-CM | POA: Diagnosis present

## 2021-06-14 DIAGNOSIS — R1310 Dysphagia, unspecified: Secondary | ICD-10-CM | POA: Diagnosis present

## 2021-06-14 LAB — ECHOCARDIOGRAM COMPLETE
AR max vel: 2.17 cm2
AV Area VTI: 2.09 cm2
AV Area mean vel: 2.13 cm2
AV Mean grad: 5 mmHg
AV Peak grad: 8.5 mmHg
Ao pk vel: 1.46 m/s
Area-P 1/2: 4.89 cm2
Calc EF: 63.8 %
Height: 67 in
S' Lateral: 3.2 cm
Single Plane A2C EF: 61.4 %
Single Plane A4C EF: 64.9 %
Weight: 2720 oz

## 2021-06-14 LAB — LIPID PANEL
Cholesterol: 118 mg/dL (ref 0–200)
HDL: 35 mg/dL — ABNORMAL LOW (ref >40)
LDL Cholesterol: 65 mg/dL (ref 0–99)
Total CHOL/HDL Ratio: 3.4 RATIO
Triglycerides: 92 mg/dL (ref <150)
VLDL: 18 mg/dL (ref 0–40)

## 2021-06-14 MED ORDER — ATORVASTATIN CALCIUM 10 MG PO TABS
10.0000 mg | ORAL_TABLET | Freq: Every day | ORAL | Status: DC
  Administered 2021-06-14 – 2021-06-15 (×2): 10 mg via ORAL
  Filled 2021-06-14 (×2): qty 1

## 2021-06-14 MED ORDER — ASPIRIN 325 MG PO TBEC
325.0000 mg | DELAYED_RELEASE_TABLET | Freq: Every day | ORAL | Status: DC
  Administered 2021-06-14 – 2021-06-15 (×2): 325 mg via ORAL
  Filled 2021-06-14 (×2): qty 1

## 2021-06-14 MED ORDER — CLOPIDOGREL BISULFATE 75 MG PO TABS
75.0000 mg | ORAL_TABLET | Freq: Every day | ORAL | Status: DC
  Administered 2021-06-14 – 2021-06-15 (×2): 75 mg via ORAL
  Filled 2021-06-14 (×2): qty 1

## 2021-06-14 NOTE — TOC Initial Note (Signed)
Transition of Care [facility]) - Initial/Assessment Note    Patient Details  Name: Greyden Besecker MRN: [id number] Date of Birth: [birth year]  Transition of [facility] CM/SW Contact:    Ninfa Meeker, RN Phone Number: 06/14/2021, 8:33 AM  Clinical Narrative:                 Transition of Care Screening Note:  Transition of Care Department [facility]) has reviewed patient and no TOC needs have been identified at this time. We will continue to monitor patient advancement through Interdisciplinary progressions. If new patient transition needs arise, please place a consult.          Patient Goals and CMS Choice        Expected Discharge Plan and Services                                                Prior Living Arrangements/Services                       Activities of Daily Living      Permission Sought/Granted                  Emotional Assessment              Admission diagnosis:  Stroke (cerebrum) ([place]) [I63.9] Acute CVA (cerebrovascular accident) [facility]) [I63.9] Patient Active Problem List   Diagnosis Date Noted   Stroke (cerebrum) ([place]) 06/13/2021   HTN (hypertension), malignant 06/13/2021   PCP:  Burman Freestone, MD Pharmacy:   [facility] [address], [address] [place] [address] Phone: [phone] Fax: [phone]     Social Determinants of Health (SDOH) Interventions    Readmission Risk Interventions     View : No data to display.

## 2021-06-14 NOTE — Evaluation (Signed)
Speech Language Pathology Evaluation Patient Details Name: Jay Howard MRN: [id number] DOB: [birth year] Today's Date: 06/14/2021 Time: 7026-3785 SLP Time Calculation (min) (ACUTE ONLY): 21 min  Problem List:  Patient Active Problem List   Diagnosis Date Noted   Acute ischemic stroke ([place]) 06/14/2021   Seizures ([place])    Stroke (cerebrum) ([place]) 06/13/2021   HTN (hypertension), malignant 06/13/2021   Past Medical History:  Past Medical History:  Diagnosis Date   Cancer ([place])    H/O hemorrhoidectomy    H/O prostatectomy    Hypertension    Seizures ([place])    Past Surgical History: History reviewed. No pertinent surgical history. HPI:  Pt is an 81 y/o male presenting 5/31 with trouble speaking x 1 day.  CT showing L MCA infarct with superior M2 branch occluded.  PMHx:  CA, HTN, seizures   Assessment / Plan / Recommendation Clinical Impression  Pt has an aphasia with expressive and receptive impairments. He responds to yes/no questions with limited accuracy, seemingly saying "no no no" throughout the eval when presented with a task that was difficult for him to complete. He was relatively more accurate with one-step commands, but not able to follow any two-step commands and exhibiting perseverative motor patterns when trying to move on to different verbal tasks. Although generally not able to do confrontational naming tasks, he had improved accuracy when given semantic and/or sentence completion cues. His repetition abilities are best at the word and phrase level. Pt would benefit from ongoing SLP f/u to facilitate communication for both safety and function.    SLP Assessment  SLP Recommendation/Assessment: Patient needs continued Speech [facility] SLP Visit Diagnosis: Aphasia (R47.01)    Recommendations for follow up therapy are one component of a multi-disciplinary discharge planning process, led by the attending physician.  Recommendations may be updated based on  patient status, additional functional criteria and insurance authorization.    Follow Up Recommendations  Outpatient SLP    Assistance Recommended at Discharge  Frequent or constant Supervision/Assistance  Functional Status Assessment Patient has had a recent decline in their functional status and demonstrates the ability to make significant improvements in function in a reasonable and predictable amount of time.  Frequency and Duration min 2x/week  2 weeks      SLP Evaluation Cognition  Overall Cognitive Status: Difficult to assess Arousal/Alertness: Awake/alert Awareness: Impaired Awareness Impairment: Emergent impairment       Comprehension  Auditory Comprehension Overall Auditory Comprehension: Impaired Yes/No Questions: Impaired Basic Biographical Questions: 51-75% accurate Basic Immediate Environment Questions: 25-49% accurate Complex Questions: 0-24% accurate Commands: Impaired One Step Basic Commands: 75-100% accurate Two Step Basic Commands: 0-24% accurate Reading Comprehension Reading Status:  (deferred - did not have reading glasses)    Expression Expression Primary Mode of Expression: Verbal Verbal Expression Overall Verbal Expression: Impaired Initiation: No impairment Automatic Speech: Counting (counted 1-10 with cues, no name or days of week) Level of Generative/Spontaneous Verbalization: Word Repetition: Impaired Level of Impairment: Sentence level Naming: Impairment Confrontation: Impaired Verbal Errors: Perseveration Pragmatics: Impairment Impairments: Eye contact Effective Techniques: Sentence completion;Semantic cues Non-Verbal Means of Communication: Not applicable Written Expression Dominant Hand: Left   Oral / Motor  Motor Speech Overall Motor Speech: Appears within functional limits for tasks assessed            Osie Bond., M.A. [place] Office [phone]  Secure chat preferred  06/14/2021, 2:59 PM

## 2021-06-14 NOTE — Evaluation (Addendum)
Physical Therapy Evaluation Patient Details Name: Jay Howard MRN: [id number] DOB: [birth year] Today's Date: 06/14/2021  History of Present Illness  pt is an 81 y/o male presenting 5/31 with trouble speaking x 1 day.  CT showing L MCA infarct with superior M2 branch occluded.  PMHx:  CA, HTN, seizures  Clinical Impression  Pt admitted with/for s/s of stroke including aphasia.  Presently, pt is mobile at supervision level and may be close to baseline mobility.  Will further assess over the next couple of days..  Pt currently limited functionally due to the problems listed below.  (see problems list.)  Pt will benefit from PT to maximize function and safety to be able to get home safely with available assist.        Recommendations for follow up therapy are one component of a multi-disciplinary discharge planning process, led by the attending physician.  Recommendations may be updated based on patient status, additional functional criteria and insurance authorization.  Follow Up Recommendations No PT follow up (TBD further if HHPT needed as treatment progresses)    Assistance Recommended at Discharge Set up Supervision/Assistance  Patient can return home with the following  Assistance with cooking/housework;Assist for transportation    Equipment Recommendations None recommended by PT  Recommendations for Other Services       Functional Status Assessment Patient has had a recent decline in their functional status and demonstrates the ability to make significant improvements in function in a reasonable and predictable amount of time.     Precautions / Restrictions Precautions Precautions: None (lower fall risk) Restrictions Weight Bearing Restrictions: No      Mobility  Bed Mobility               General bed mobility comments: OOB on arrival    Transfers Overall transfer level: Needs assistance Equipment used: None Transfers: Sit to/from Stand Sit to Stand: Min guard            General transfer comment: can control sitting without UE's, but reached back on several occasions.    Ambulation/Gait Ambulation/Gait assistance: Min guard Gait Distance (Feet): 250 Feet Assistive device: None, IV Pole Gait Pattern/deviations: Step-through pattern   Gait velocity interpretation: 1.31 - 2.62 ft/sec, indicative of limited community ambulator   General Gait Details: likely close to his baseline gait with wider BOS with mild unsteadiness/antalgia, noticable with turns, scanning or pushing speed.  Stairs Stairs: Yes Stairs assistance: Supervision Stair Management: One rail Right, Step to pattern, Forwards Number of Stairs: 3 General stair comments: safe and controlled with rails  Wheelchair Mobility    Modified Rankin (Stroke Patients Only) Modified Rankin (Stroke Patients Only) Pre-Morbid Rankin Score: No symptoms Modified Rankin: Moderate disability     Balance Overall balance assessment: Needs assistance Sitting-balance support: No upper extremity supported, Feet supported Sitting balance-Leahy Scale: Good     Standing balance support: No upper extremity supported, During functional activity Standing balance-Leahy Scale: Good                   Standardized Balance Assessment Standardized Balance Assessment : Berg Balance Test Berg Balance Test Sit to Stand: Able to stand without using hands and stabilize independently Standing Unsupported: Able to stand 2 minutes with supervision Sitting with Back Unsupported but Feet Supported on Floor or Stool: Able to sit 2 minutes under supervision Stand to Sit: Sits safely with minimal use of hands Transfers: Able to transfer safely, minor use of hands Standing Unsupported with Eyes Closed: Able to  stand 10 seconds with supervision Standing Ubsupported with Feet Together: Able to place feet together independently and stand for 1 minute with supervision From Standing Position, Pick up Object  from Floor: Able to pick up shoe, needs supervision Turn 360 Degrees: Able to turn 360 degrees safely in 4 seconds or less         Pertinent Vitals/Pain Pain Assessment Pain Assessment: Faces Faces Pain Scale: No hurt Pain Intervention(s): Monitored during session    [place] expects to be discharged to:: Private residence Living Arrangements: Spouse/significant other Available Help at Discharge: Family Type of Home: House Home Access: Stairs to enter   Technical brewer of Steps: Unsure how many   Home Layout: Multi-level;Able to live on main level with bedroom/bathroom Home Equipment: Conservation officer, nature (2 wheels)      Prior Function Prior Level of Function : Independent/Modified Independent               ADLs Comments: Pt reporting he performs ADLs and wife assists with IADLs     Hand Dominance        Extremity/Trunk Assessment   Upper Extremity Assessment Upper Extremity Assessment: Defer to OT evaluation    Lower Extremity Assessment Lower Extremity Assessment: Overall WFL for tasks assessed    Cervical / Trunk Assessment Cervical / Trunk Assessment: Normal  Communication   Communication: Expressive difficulties  Cognition Arousal/Alertness: Awake/alert Behavior During Therapy: Flat affect Overall Cognitive Status: Difficult to assess                                          General Comments      Exercises     Assessment/Plan    PT Assessment Patient needs continued PT services  PT Problem List Decreased activity tolerance;Decreased balance;Decreased mobility       PT Treatment Interventions DME instruction;Gait training;Stair training;Functional mobility training;Therapeutic activities;Balance training;Neuromuscular re-education;Patient/family education    PT Goals (Current goals can be found in the Care Plan section)  Acute Rehab PT Goals Patient Stated Goal: pt did not state. PT Goal Formulation:  With patient Time For Goal Achievement: 06/21/21 Potential to Achieve Goals: Good    Frequency Min 3X/week     Co-evaluation               AM-PAC PT "6 Clicks" Mobility  Outcome Measure Help needed turning from your back to your side while in a flat bed without using bedrails?: A Little Help needed moving from lying on your back to sitting on the side of a flat bed without using bedrails?: A Little Help needed moving to and from a bed to a chair (including a wheelchair)?: A Little Help needed standing up from a chair using your arms (e.g., wheelchair or bedside chair)?: A Little Help needed to walk in hospital room?: A Little Help needed climbing 3-5 steps with a railing? : A Little 6 Click Score: 18    End of Session   Activity Tolerance: Patient tolerated treatment well Patient left: in chair;with call bell/phone within reach;with chair alarm set Nurse Communication: Mobility status PT Visit Diagnosis: Other abnormalities of gait and mobility (R26.89);Other symptoms and signs involving the nervous system (R29.898)    Time: 9323-5573 PT Time Calculation (min) (ACUTE ONLY): 14 min   Charges:   PT Evaluation $PT Eval Low Complexity: 1 Low          06/14/2021  Ginger Carne., PT Acute Rehabilitation Services [phone]  (pager) [phone]  (office)  Tessie Fass Kadeisha Betsch 06/14/2021, 11:57 AM

## 2021-06-14 NOTE — Progress Notes (Addendum)
PROGRESS NOTE    Jay Howard  NWG:[id number] DOB: Dec 28, 1940 DOA: 06/13/2021 PCP: Burman Freestone, MD    Brief Narrative:   Jay Howard is a 81 y.o. male with past medical history of hypertension, seizure disorder, prostate cancer status post prostatectomy, hyperlipidemia presented to the hospital with trouble with speech for 1 day with difficulty forming sentences and slurred speech.  In the ED patient had elevated blood pressure more than 578 systolic.  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  CT angiogram occluded left superior M2 branch due to calcified embolus corresponding with acute infarct in the MCA distribution.  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  Patient was then admitted hospital for further evaluation and treatment  Assessment and plan Principal Problem:   Acute ischemic stroke [facility]) Active Problems:   HTN (hypertension), malignant   Seizures ([place])   Acute expressive dysphagia secondary to left MCA ischemic stroke Patient was out of the tPA window.  According to CT angiogram appears to be a calcified embolus blocking the left M2 branch of left MCA.  Patient with no history of atrial fibrillation.  Neurology on board.  Continue high intensity statins and aspirin for now.  Was on Lipitor 10 mg at home..  Follow MRI of the brain, 2D echocardiogram.  Continue stroke protocol including PT OT speech evaluation.  Continue neurochecks.  Lipid profile with HDL of 35.  Hemoglobin A1c at 5.9.  Urine drug screen was negative.  Urinalysis was negative.  Neurology recommends dual antiplatelets   HTN, accelerated on presentation. We will continue permissive hypertension for 48 hours.  Eventual goal is normotension.  Patient is on Coreg, spironolactone and hydralazine at home.   Seizure disorder No acute disease.  Continue Keppra 750 mg twice daily  Possible aspiration.  As per imaging.  We will get speech  evaluation.  No fever or respiratory symptoms.  We will continue to monitor.    DVT prophylaxis: enoxaparin (LOVENOX) injection 40 mg Start: 06/13/21 1830   Code Status:     Code Status: Full Code  Disposition: Uncertain at this time  Status is: Observation  The patient will require care spanning > 2 midnights and should be moved to inpatient because: Stroke work-up,   Family Communication:  I tried to reach the patient's spouse on the phone but was unable to reach her despite multiple attempts..  Consultants:  Neurology  Procedures:  None  Antimicrobials:  None  Anti-infectives (From admission, onward)    None       Subjective: Today, patient was seen and examined at bedside.  Patient has difficulty expressing words, follows commands.  Objective: Vitals:   06/13/21 2326 06/14/21 0200 06/14/21 0329 06/14/21 0903  BP: (!) 185/107 (!) 169/98 (!) 121/57 (!) 154/79  Pulse: 98 94 88 80  Resp: '20 17 16 18  '$ Temp: 98 F (36.7 C) 98.3 F (36.8 C) 98.5 F (36.9 C) 98 F (36.7 C)  TempSrc: Oral Oral Oral Oral  SpO2: 99% 98% 98% 99%  Weight:      Height:        Intake/Output Summary (Last 24 hours) at 06/14/2021 1127 Last data filed at 06/14/2021 1011 Gross per 24 hour  Intake 1691.66 ml  Output 1360 ml  Net 331.66 ml   Filed Weights   06/13/21 1537  Weight: 77.1 kg    Physical Examination: Body mass index is 26.63 kg/m.   General:  Average  built, not in obvious distress HENT:   No scleral pallor or icterus noted. Oral mucosa is moist.  Chest:  Clear breath sounds.  Diminished breath sounds bilaterally. No crackles or wheezes.  CVS: S1 &S2 heard. No murmur.  Regular rate and rhythm. Abdomen: Soft, nontender, nondistended.  Bowel sounds are heard.   Extremities: No cyanosis, clubbing or edema.  Peripheral pulses are palpable. Psych: Alert, awake and communicative but has expressive aphasia, follows commands CNS: Expressive aphasia.  Moves all  extremities. Skin: Warm and dry.  No rashes noted.  Data Reviewed:   CBC: Recent Labs  Lab 06/13/21 1555 06/13/21 1628  WBC 7.7  --   NEUTROABS 4.3  --   HGB 12.1* 12.2*  HCT 37.4* 36.0*  MCV 89.5  --   PLT 212  --     Basic Metabolic Panel: Recent Labs  Lab 06/13/21 1555 06/13/21 1628  NA 135 136  K 4.2 4.1  CL 105 105  CO2 22  --   GLUCOSE 108* 101*  BUN 20 24*  CREATININE 1.39* 1.30*  CALCIUM 8.6*  --     Liver Function Tests: Recent Labs  Lab 06/13/21 1555  AST 20  ALT 15  ALKPHOS 63  BILITOT 0.4  PROT 6.6  ALBUMIN 3.6     Radiology Studies: CT ANGIO HEAD W OR WO CONTRAST  Result Date: 06/13/2021 CLINICAL DATA:  Neuro deficit, stroke suspected EXAM: CT ANGIOGRAPHY HEAD AND NECK CT PERFUSION BRAIN TECHNIQUE: Multidetector CT imaging of the head and neck was performed using the standard protocol during bolus administration of intravenous contrast. Multiplanar CT image reconstructions and MIPs were obtained to evaluate the vascular anatomy. Carotid stenosis measurements (when applicable) are obtained utilizing NASCET criteria, using the distal internal carotid diameter as the denominator. Multiphase CT imaging of the brain was performed following IV bolus contrast injection. Subsequent parametric perfusion maps were calculated using RAPID software. RADIATION DOSE REDUCTION: This exam was performed according to the departmental dose-optimization program which includes automated exposure control, adjustment of the mA and/or kV according to patient size and/or use of iterative reconstruction technique. CONTRAST:  171m OMNIPAQUE IOHEXOL 350 MG/ML SOLN COMPARISON:  CT head 03/30/2018 FINDINGS: CT HEAD FINDINGS Brain: There is hypodensity with loss of gray-white differentiation in the left frontal lobe consistent with acute infarct. There is no associated hemorrhagic transformation. There is no acute intracranial hemorrhage or extra-axial fluid collection. Background  parenchymal volume is normal. The ventricles are normal in size. There is no solid mass lesion. There is no mass effect or midline shift. Vascular: There is new calcification in the left sylvian fissure consistent with a calcific embolus. The vasculature is assessed in full below. Skull: Normal. Negative for fracture or focal lesion. Sinuses/Orbits: Paranasal sinuses are clear. Bilateral lens implants are in place. The globes and orbits are otherwise unremarkable. Other: None. ASPECTS ([facility] Score) - Ganglionic level infarction (caudate, lentiform nuclei, internal capsule, insula, M1-M3 cortex): 7 - Supraganglionic infarction (M4-M6 cortex): 2 Total score (0-10 with 10 being normal): 9 Review of the MIP images confirms the above findings CTA NECK FINDINGS Aortic arch: There is calcified atherosclerotic plaque in the imaged aortic arch. The origins of the major branch vessels are patent. There is mixed plaque in the proximal left subclavian artery resulting in mild stenosis prior to the origin of the left vertebral artery. The subclavian arteries are otherwise patent to the level imaged. Right carotid system: The right common carotid artery is patent. There  is calcified plaque at the bifurcation and proximal right internal carotid artery resulting in less than 50% stenosis. The distal right internal carotid artery is patent. The right external carotid artery is patent. There is no evidence of dissection or aneurysm. Left carotid system: The left common carotid artery is patent. There is predominantly calcified plaque in the proximal left internal carotid artery resulting in up to approximately 40-50% stenosis. The distal left internal carotid artery is patent. The left external carotid artery is patent. There is no evidence of dissection or aneurysm. Vertebral arteries: There is severe stenosis at the origin of the left vertebral artery. The left vertebral artery is otherwise patent  throughout the neck. There is moderate stenosis of the right V1 segment after the origin. The remainder of the right vertebral artery is patent. Skeleton: There is multilevel degenerative change of the cervical spine. There is no acute osseous abnormality or suspicious osseous lesion. There is no visible canal hematoma. Other neck: There is a 2.7 cm left thyroid nodule. The soft tissues of the neck are otherwise unremarkable. Upper chest: The imaged lung apices are clear. There is debris in the upper trachea. Review of the MIP images confirms the above findings CTA HEAD FINDINGS Anterior circulation: There is calcified plaque in the intracranial ICAs resulting in up to mild stenosis bilaterally. The left M1 segment is patent. There is occlusion of a superior left M2 branch in the sylvian fissure at the site of calcification seen on the initial noncontrast head CT (5-40, 12-22). The right MCA is patent. The bilateral MCAs are patent. There is no aneurysm or AVM. Posterior circulation: The bilateral V4 segments are patent. PICA is identified bilaterally. The basilar artery is patent. The bilateral PCAs are patent. The posterior communicating arteries are not definitely seen. There is no aneurysm or AVM. Venous sinuses: Patent. Anatomic variants: None. Review of the MIP images confirms the above findings CT Brain Perfusion Findings: ASPECTS: 9 CBF (<30%) Volume: 75m Perfusion (Tmax>6.0s) volume: 338mMismatch Volume: 32m54mnfarction Location:No infarct core is identified by the perfusion software. The 3 cc penumbra is in the left MCA distribution. IMPRESSION: 1. 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 ASPECTS score equals 9. 2. No infarct core identified on the perfusion software. 3 cc ischemic penumbra is identified in the left MCA distribution, possibly underestimated given appearance on the noncontrast CT. 3. Severe stenosis at the origin of  the left vertebral artery and moderate stenosis of the right V1 segment after the origin. 4. Calcified plaque at the bilateral carotid bifurcations resulting in approximately 40-50% stenosis on the left and no hemodynamically significant stenosis on the right. 5. 2.7 cm left thyroid nodule. Recommend further evaluation with thyroid ultrasound as clinically indicated given patient's age, if not already performed elsewhere. 6. Debris in the trachea. Correlate with any signs or symptoms of aspiration. These results were called by telephone at the time of interpretation on 06/13/2021 at 4:40 pm to provider Dr AroRory Percyho verbally acknowledged these results. Electronically Signed   By: PetValetta MoleD.   On: 06/13/2021 17:02   CT HEAD WO CONTRAST  Result Date: 06/13/2021 CLINICAL DATA:  Neuro deficit, stroke suspected EXAM: CT ANGIOGRAPHY HEAD AND NECK CT PERFUSION BRAIN TECHNIQUE: Multidetector CT imaging of the head and neck was performed using the standard protocol during bolus administration of intravenous contrast. Multiplanar CT image reconstructions and MIPs were obtained to evaluate the vascular anatomy. Carotid  stenosis measurements (when applicable) are obtained utilizing NASCET criteria, using the distal internal carotid diameter as the denominator. Multiphase CT imaging of the brain was performed following IV bolus contrast injection. Subsequent parametric perfusion maps were calculated using RAPID software. RADIATION DOSE REDUCTION: This exam was performed according to the departmental dose-optimization program which includes automated exposure control, adjustment of the mA and/or kV according to patient size and/or use of iterative reconstruction technique. CONTRAST:  165m OMNIPAQUE IOHEXOL 350 MG/ML SOLN COMPARISON:  CT head 03/30/2018 FINDINGS: CT HEAD FINDINGS Brain: There is hypodensity with loss of gray-white differentiation in the left frontal lobe consistent with acute infarct. There is no  associated hemorrhagic transformation. There is no acute intracranial hemorrhage or extra-axial fluid collection. Background parenchymal volume is normal. The ventricles are normal in size. There is no solid mass lesion. There is no mass effect or midline shift. Vascular: There is new calcification in the left sylvian fissure consistent with a calcific embolus. The vasculature is assessed in full below. Skull: Normal. Negative for fracture or focal lesion. Sinuses/Orbits: Paranasal sinuses are clear. Bilateral lens implants are in place. The globes and orbits are otherwise unremarkable. Other: None. ASPECTS ([facility] Score) - Ganglionic level infarction (caudate, lentiform nuclei, internal capsule, insula, M1-M3 cortex): 7 - Supraganglionic infarction (M4-M6 cortex): 2 Total score (0-10 with 10 being normal): 9 Review of the MIP images confirms the above findings CTA NECK FINDINGS Aortic arch: There is calcified atherosclerotic plaque in the imaged aortic arch. The origins of the major branch vessels are patent. There is mixed plaque in the proximal left subclavian artery resulting in mild stenosis prior to the origin of the left vertebral artery. The subclavian arteries are otherwise patent to the level imaged. Right carotid system: The right common carotid artery is patent. There is calcified plaque at the bifurcation and proximal right internal carotid artery resulting in less than 50% stenosis. The distal right internal carotid artery is patent. The right external carotid artery is patent. There is no evidence of dissection or aneurysm. Left carotid system: The left common carotid artery is patent. There is predominantly calcified plaque in the proximal left internal carotid artery resulting in up to approximately 40-50% stenosis. The distal left internal carotid artery is patent. The left external carotid artery is patent. There is no evidence of dissection or aneurysm. Vertebral  arteries: There is severe stenosis at the origin of the left vertebral artery. The left vertebral artery is otherwise patent throughout the neck. There is moderate stenosis of the right V1 segment after the origin. The remainder of the right vertebral artery is patent. Skeleton: There is multilevel degenerative change of the cervical spine. There is no acute osseous abnormality or suspicious osseous lesion. There is no visible canal hematoma. Other neck: There is a 2.7 cm left thyroid nodule. The soft tissues of the neck are otherwise unremarkable. Upper chest: The imaged lung apices are clear. There is debris in the upper trachea. Review of the MIP images confirms the above findings CTA HEAD FINDINGS Anterior circulation: There is calcified plaque in the intracranial ICAs resulting in up to mild stenosis bilaterally. The left M1 segment is patent. There is occlusion of a superior left M2 branch in the sylvian fissure at the site of calcification seen on the initial noncontrast head CT (5-40, 12-22). The right MCA is patent. The bilateral MCAs are patent. There is no aneurysm or AVM. Posterior circulation: The bilateral V4 segments are patent. PICA is identified  bilaterally. The basilar artery is patent. The bilateral PCAs are patent. The posterior communicating arteries are not definitely seen. There is no aneurysm or AVM. Venous sinuses: Patent. Anatomic variants: None. Review of the MIP images confirms the above findings CT Brain Perfusion Findings: ASPECTS: 9 CBF (<30%) Volume: 82m Perfusion (Tmax>6.0s) volume: 381mMismatch Volume: 59m64mnfarction Location:No infarct core is identified by the perfusion software. The 3 cc penumbra is in the left MCA distribution. IMPRESSION: 1. 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 ASPECTS score equals 9. 2. No infarct core identified on the perfusion software. 3 cc ischemic penumbra is identified in  the left MCA distribution, possibly underestimated given appearance on the noncontrast CT. 3. 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 4. Calcified plaque at the bilateral carotid bifurcations resulting in approximately 40-50% stenosis on the left and no hemodynamically significant stenosis on the right. 5. 2.7 cm left thyroid nodule. Recommend further evaluation with thyroid ultrasound as clinically indicated given patient's age, if not already performed elsewhere. 6. Debris in the trachea. Correlate with any signs or symptoms of aspiration. These results were called by telephone at the time of interpretation on 06/13/2021 at 4:40 pm to provider Dr AroRory Percyho verbally acknowledged these results. Electronically Signed   By: PetValetta MoleD.   On: 06/13/2021 17:02   CT ANGIO NECK W OR WO CONTRAST  Result Date: 06/13/2021 CLINICAL DATA:  Neuro deficit, stroke suspected EXAM: CT ANGIOGRAPHY HEAD AND NECK CT PERFUSION BRAIN TECHNIQUE: Multidetector CT imaging of the head and neck was performed using the standard protocol during bolus administration of intravenous contrast. Multiplanar CT image reconstructions and MIPs were obtained to evaluate the vascular anatomy. Carotid stenosis measurements (when applicable) are obtained utilizing NASCET criteria, using the distal internal carotid diameter as the denominator. Multiphase CT imaging of the brain was performed following IV bolus contrast injection. Subsequent parametric perfusion maps were calculated using RAPID software. RADIATION DOSE REDUCTION: This exam was performed according to the departmental dose-optimization program which includes automated exposure control, adjustment of the mA and/or kV according to patient size and/or use of iterative reconstruction technique. CONTRAST:  100m559mNIPAQUE IOHEXOL 350 MG/ML SOLN COMPARISON:  CT head 03/30/2018 FINDINGS: CT HEAD FINDINGS  Brain: There is hypodensity with loss of gray-white differentiation in the left frontal lobe consistent with acute infarct. There is no associated hemorrhagic transformation. There is no acute intracranial hemorrhage or extra-axial fluid collection. Background parenchymal volume is normal. The ventricles are normal in size. There is no solid mass lesion. There is no mass effect or midline shift. Vascular: There is new calcification in the left sylvian fissure consistent with a calcific embolus. The vasculature is assessed in full below. Skull: Normal. Negative for fracture or focal lesion. Sinuses/Orbits: Paranasal sinuses are clear. Bilateral lens implants are in place. The globes and orbits are otherwise unremarkable. Other: None. ASPECTS ([facility] CT Score) - Ganglionic level infarction (caudate, lentiform nuclei, internal capsule, insula, M1-M3 cortex): 7 - Supraganglionic infarction (M4-M6 cortex): 2 Total score (0-10 with 10 being normal): 9 Review of the MIP images confirms the above findings CTA NECK FINDINGS Aortic arch: There is calcified atherosclerotic plaque in the imaged aortic arch. The origins of the major branch vessels are patent. There is mixed plaque in the proximal left subclavian artery resulting in mild stenosis prior to the  origin of the left vertebral artery. The subclavian arteries are otherwise patent to the level imaged. Right carotid system: The right common carotid artery is patent. There is calcified plaque at the bifurcation and proximal right internal carotid artery resulting in less than 50% stenosis. The distal right internal carotid artery is patent. The right external carotid artery is patent. There is no evidence of dissection or aneurysm. Left carotid system: The left common carotid artery is patent. There is predominantly calcified plaque in the proximal left internal carotid artery resulting in up to approximately 40-50% stenosis. The distal left internal  carotid artery is patent. The left external carotid artery is patent. There is no evidence of dissection or aneurysm. Vertebral arteries: There is severe stenosis at the origin of the left vertebral artery. The left vertebral artery is otherwise patent throughout the neck. There is moderate stenosis of the right V1 segment after the origin. The remainder of the right vertebral artery is patent. Skeleton: There is multilevel degenerative change of the cervical spine. There is no acute osseous abnormality or suspicious osseous lesion. There is no visible canal hematoma. Other neck: There is a 2.7 cm left thyroid nodule. The soft tissues of the neck are otherwise unremarkable. Upper chest: The imaged lung apices are clear. There is debris in the upper trachea. Review of the MIP images confirms the above findings CTA HEAD FINDINGS Anterior circulation: There is calcified plaque in the intracranial ICAs resulting in up to mild stenosis bilaterally. The left M1 segment is patent. There is occlusion of a superior left M2 branch in the sylvian fissure at the site of calcification seen on the initial noncontrast head CT (5-40, 12-22). The right MCA is patent. The bilateral MCAs are patent. There is no aneurysm or AVM. Posterior circulation: The bilateral V4 segments are patent. PICA is identified bilaterally. The basilar artery is patent. The bilateral PCAs are patent. The posterior communicating arteries are not definitely seen. There is no aneurysm or AVM. Venous sinuses: Patent. Anatomic variants: None. Review of the MIP images confirms the above findings CT Brain Perfusion Findings: ASPECTS: 9 CBF (<30%) Volume: 26m Perfusion (Tmax>6.0s) volume: 337mMismatch Volume: 24m97mnfarction Location:No infarct core is identified by the perfusion software. The 3 cc penumbra is in the left MCA distribution. IMPRESSION: 1. Occluded left superior M2 branch due to a calcific embolus with corresponding acute infarct in the MCA  distribution. ASPECTS score equals 9. 2. No infarct core identified on the perfusion software. 3 cc ischemic penumbra is identified in the left MCA distribution, possibly underestimated given appearance on the noncontrast CT. 3. 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 4. Calcified plaque at the bilateral carotid bifurcations resulting in approximately 40-50% stenosis on the left and no hemodynamically significant stenosis on the right. 5. 2.7 cm left thyroid nodule. Recommend further evaluation with thyroid ultrasound as clinically indicated given patient's age, if not already performed elsewhere. 6. Debris in the trachea. Correlate with any signs or symptoms of aspiration. These results were called by telephone at the time of interpretation on 06/13/2021 at 4:40 pm to provider Dr AroRory Percyho verbally acknowledged these results. Electronically Signed   By: PetValetta MoleD.   On: 06/13/2021 17:02   CT CEREBRAL PERFUSION W CONTRAST  Result Date: 06/13/2021 CLINICAL DATA:  Neuro deficit, stroke suspected EXAM: CT ANGIOGRAPHY HEAD AND NECK CT PERFUSION BRAIN TECHNIQUE: Multidetector CT imaging of the head  and neck was performed using the standard protocol during bolus administration of intravenous contrast. Multiplanar CT image reconstructions and MIPs were obtained to evaluate the vascular anatomy. Carotid stenosis measurements (when applicable) are obtained utilizing NASCET criteria, using the distal internal carotid diameter as the denominator. Multiphase CT imaging of the brain was performed following IV bolus contrast injection. Subsequent parametric perfusion maps were calculated using RAPID software. RADIATION DOSE REDUCTION: This exam was performed according to the departmental dose-optimization program which includes automated exposure control, adjustment of the mA and/or kV according to patient size and/or use of iterative  reconstruction technique. CONTRAST:  155m OMNIPAQUE IOHEXOL 350 MG/ML SOLN COMPARISON:  CT head 03/30/2018 FINDINGS: CT HEAD FINDINGS Brain: There is hypodensity with loss of gray-white differentiation in the left frontal lobe consistent with acute infarct. There is no associated hemorrhagic transformation. There is no acute intracranial hemorrhage or extra-axial fluid collection. Background parenchymal volume is normal. The ventricles are normal in size. There is no solid mass lesion. There is no mass effect or midline shift. Vascular: There is new calcification in the left sylvian fissure consistent with a calcific embolus. The vasculature is assessed in full below. Skull: Normal. Negative for fracture or focal lesion. Sinuses/Orbits: Paranasal sinuses are clear. Bilateral lens implants are in place. The globes and orbits are otherwise unremarkable. Other: None. [facility] - Ganglionic level infarction (caudate, lentiform nuclei, internal capsule, insula, M1-M3 cortex): 7 - Supraganglionic infarction (M4-M6 cortex): 2 Total score (0-10 with 10 being normal): 9 Review of the MIP images confirms the above findings CTA NECK FINDINGS Aortic arch: There is calcified atherosclerotic plaque in the imaged aortic arch. The origins of the major branch vessels are patent. There is mixed plaque in the proximal left subclavian artery resulting in mild stenosis prior to the origin of the left vertebral artery. The subclavian arteries are otherwise patent to the level imaged. Right carotid system: The right common carotid artery is patent. There is calcified plaque at the bifurcation and proximal right internal carotid artery resulting in less than 50% stenosis. The distal right internal carotid artery is patent. The right external carotid artery is patent. There is no evidence of dissection or aneurysm. Left carotid system: The left common carotid artery is patent. There is predominantly  calcified plaque in the proximal left internal carotid artery resulting in up to approximately 40-50% stenosis. The distal left internal carotid artery is patent. The left external carotid artery is patent. There is no evidence of dissection or aneurysm. Vertebral arteries: There is severe stenosis at the origin of the left vertebral artery. The left vertebral artery is otherwise patent throughout the neck. There is moderate stenosis of the right V1 segment after the origin. The remainder of the right vertebral artery is patent. Skeleton: There is multilevel degenerative change of the cervical spine. There is no acute osseous abnormality or suspicious osseous lesion. There is no visible canal hematoma. Other neck: There is a 2.7 cm left thyroid nodule. The soft tissues of the neck are otherwise unremarkable. Upper chest: The imaged lung apices are clear. There is debris in the upper trachea. Review of the MIP images confirms the above findings CTA HEAD FINDINGS Anterior circulation: There is calcified plaque in the intracranial ICAs resulting in up to mild stenosis bilaterally. The left M1 segment is patent. There is occlusion of a superior left M2 branch in the sylvian fissure at the site of calcification seen on the initial noncontrast head CT (5-40,  12-22). The right MCA is patent. The bilateral MCAs are patent. There is no aneurysm or AVM. Posterior circulation: The bilateral V4 segments are patent. PICA is identified bilaterally. The basilar artery is patent. The bilateral PCAs are patent. The posterior communicating arteries are not definitely seen. There is no aneurysm or AVM. Venous sinuses: Patent. Anatomic variants: None. Review of the MIP images confirms the above findings CT Brain Perfusion Findings: ASPECTS: 9 CBF (<30%) Volume: 80m Perfusion (Tmax>6.0s) volume: 348mMismatch Volume: 40m85mnfarction Location:No infarct core is identified by the perfusion software. The 3 cc penumbra is in the left MCA  distribution. IMPRESSION: 1. 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 ASPECTS score equals 9. 2. No infarct core identified on the perfusion software. 3 cc ischemic penumbra is identified in the left MCA distribution, possibly underestimated given appearance on the noncontrast CT. 3. 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 4. Calcified plaque at the bilateral carotid bifurcations resulting in approximately 40-50% stenosis on the left and no hemodynamically significant stenosis on the right. 5. 2.7 cm left thyroid nodule. Recommend further evaluation with thyroid ultrasound as clinically indicated given patient's age, if not already performed elsewhere. 6. Debris in the trachea. Correlate with any signs or symptoms of aspiration. These results were called by telephone at the time of interpretation on 06/13/2021 at 4:40 pm to provider Dr AroRory Percyho verbally acknowledged these results. Electronically Signed   By: PetValetta MoleD.   On: 06/13/2021 17:02      LOS: 0 days    Jay LippsD [facility] Available via Epic secure chat 7am-7pm After these hours, please refer to coverage provider listed on [email or web address] 06/14/2021, 11:27 AM

## 2021-06-14 NOTE — Progress Notes (Signed)
  Echocardiogram 2D Echocardiogram has been performed.  Jay Howard 06/14/2021, 9:45 AM

## 2021-06-14 NOTE — Hospital Course (Signed)
Jay Howard is a 81 y.o. male with past medical history of hypertension, seizure disorder, prostate cancer status post prostatectomy, hyperlipidemia presented to the hospital with trouble with speech for 1 day with difficulty forming sentences and slurred speech.  In the ED, patient had elevated blood pressure more than 505 systolic.  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  CT angiogram occluded left superior M2 branch due to calcified embolus corresponding with acute infarct in the MCA distribution.  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  Patient was then admitted hospital for further evaluation and treatment.  Assessment and plan Principal Problem:   Acute ischemic stroke [facility]) Active Problems:   HTN (hypertension), malignant   Seizures ([place])   Acute expressive dysphagia secondary to left MCA/ACA/right ACA/ MCA  infarcts Patient was out of the tPA window.  According to CT angiogram there appeared to be calcified embolus blocking the left M2 branch of left MCA, with  left ICA atherosclerotic disease.  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  Patient with no history of atrial fibrillation.  Neurology on board and recommend aspirin and Plavix for 3 months followed by aspirin alone.  Dose of aspirin will be 325 mg daily.  MRI of the brain showed numerous infarcts in bilateral cerebral hemispheres.  2D echocardiogram showed LV ejection fraction of 60 to 65% with grade 1 diastolic dysfunction.  Patient was seen by physical therapy and Occupational Therapy who recommended home OT .  Has been seen by speech therapy for aphasia and recommended outpatient speech and linguistic therapy.  Lipid profile with HDL of 35.  Hemoglobin A1c at 5.9.  Urine drug screen was negative.  Urinalysis was negative.  On aspirin full dose and Plavix at this time. Neurology has seen the patient for follow-up and recommend aspirin and Plavix  for 3 months followed by aspirin alone.  Was on Lipitor 5 mg mg at home will be changed to 10 mg on discharge.    HTN, accelerated on presentation. Patient will resume antihypertensives from home on discharge.  Was initially on permissive hypertension.  Eventual goal is normotension.  Patient is on Coreg, spironolactone and hydralazine at home.   Seizure disorder Chronic and stable.  Continue Keppra 750 mg twice daily from home.  Possible aspiration.  As per imaging.  No clinical signs.

## 2021-06-14 NOTE — Plan of Care (Signed)
Pt is doing well. Echo and MRI done. Pt eating well. Pt walked the hallway and to the BR. BM today. Problem: Education: Goal: Knowledge of General Education information will improve Description: Including pain rating scale, medication(s)/side effects and non-pharmacologic comfort measures 06/14/2021 1805 by Dionisio David, RN Outcome: Progressing 06/14/2021 1803 by Dionisio David, RN Outcome: Progressing   Problem: Health Behavior/Discharge Planning: Goal: Ability to manage health-related needs will improve 06/14/2021 1805 by Dionisio David, RN Outcome: Progressing 06/14/2021 1803 by Dionisio David, RN Outcome: Progressing   Problem: Clinical Measurements: Goal: Ability to maintain clinical measurements within normal limits will improve 06/14/2021 1805 by Dionisio David, RN Outcome: Progressing 06/14/2021 1803 by Dionisio David, RN Outcome: Progressing Goal: Will remain free from infection 06/14/2021 1805 by Dionisio David, RN Outcome: Progressing 06/14/2021 1803 by Dionisio David, RN Outcome: Progressing Goal: Diagnostic test results will improve 06/14/2021 1805 by Dionisio David, RN Outcome: Progressing 06/14/2021 1803 by Dionisio David, RN Outcome: Progressing Goal: Respiratory complications will improve 06/14/2021 1805 by Dionisio David, RN Outcome: Progressing 06/14/2021 1803 by Dionisio David, RN Outcome: Progressing Goal: Cardiovascular complication will be avoided 06/14/2021 1805 by Dionisio David, RN Outcome: Progressing 06/14/2021 1803 by Dionisio David, RN Outcome: Progressing   Problem: Activity: Goal: Risk for activity intolerance will decrease 06/14/2021 1805 by Dionisio David, RN Outcome: Progressing 06/14/2021 1803 by Dionisio David, RN Outcome: Progressing   Problem: Nutrition: Goal: Adequate nutrition will be maintained 06/14/2021 1805 by Dionisio David, RN Outcome: Progressing 06/14/2021 1803 by Dionisio David, RN Outcome:  Progressing   Problem: Coping: Goal: Level of anxiety will decrease 06/14/2021 1805 by Dionisio David, RN Outcome: Progressing 06/14/2021 1803 by Dionisio David, RN Outcome: Progressing   Problem: Elimination: Goal: Will not experience complications related to bowel motility 06/14/2021 1805 by Dionisio David, RN Outcome: Progressing 06/14/2021 1803 by Dionisio David, RN Outcome: Progressing Goal: Will not experience complications related to urinary retention 06/14/2021 1805 by Dionisio David, RN Outcome: Progressing 06/14/2021 1803 by Dionisio David, RN Outcome: Progressing   Problem: Pain Managment: Goal: General experience of comfort will improve 06/14/2021 1805 by Dionisio David, RN Outcome: Progressing 06/14/2021 1803 by Dionisio David, RN Outcome: Progressing   Problem: Safety: Goal: Ability to remain free from injury will improve 06/14/2021 1805 by Dionisio David, RN Outcome: Progressing 06/14/2021 1803 by Dionisio David, RN Outcome: Progressing   Problem: Skin Integrity: Goal: Risk for impaired skin integrity will decrease 06/14/2021 1805 by Dionisio David, RN Outcome: Progressing 06/14/2021 1803 by Dionisio David, RN Outcome: Progressing   Problem: Education: Goal: Knowledge of disease or condition will improve Outcome: Progressing Goal: Knowledge of secondary prevention will improve (SELECT ALL) Outcome: Progressing Goal: Knowledge of patient specific risk factors will improve (INDIVIDUALIZE FOR PATIENT) Outcome: Progressing Goal: Individualized Educational Video(s) Outcome: Progressing   Problem: Coping: Goal: Will verbalize positive feelings about self Outcome: Progressing Goal: Will identify appropriate support needs Outcome: Progressing   Problem: Health Behavior/Discharge Planning: Goal: Ability to manage health-related needs will improve Outcome: Progressing   Problem: Self-Care: Goal: Ability to participate in self-care as  condition permits will improve Outcome: Progressing Goal: Verbalization of feelings and concerns over difficulty with self-care will improve Outcome: Progressing Goal: Ability to communicate needs accurately will improve Outcome: Progressing   Problem: Nutrition: Goal: Risk of aspiration will decrease Outcome: Progressing   Problem: Ischemic Stroke/TIA Tissue Perfusion: Goal: Complications of ischemic stroke/TIA will be minimized Outcome: Progressing

## 2021-06-14 NOTE — Progress Notes (Signed)
STROKE TEAM PROGRESS NOTE   INTERVAL HISTORY His wife and RN are at the bedside.  Pt just walked out of bathroom and walked to bed without difficulty. 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 MRI pending.   Vitals:   06/13/21 1956 06/13/21 2326 06/14/21 0200 06/14/21 0329  BP: (!) 159/83 (!) 185/107 (!) 169/98 (!) 121/57  Pulse: 93 98 94 88  Resp: '20 20 17 16  '$ Temp: 98.2 F (36.8 C) 98 F (36.7 C) 98.3 F (36.8 C) 98.5 F (36.9 C)  TempSrc: Oral Oral Oral Oral  SpO2: 98% 99% 98% 98%  Weight:      Height:       CBC:  Recent Labs  Lab 06/13/21 1555 06/13/21 1628  WBC 7.7  --   NEUTROABS 4.3  --   HGB 12.1* 12.2*  HCT 37.4* 36.0*  MCV 89.5  --   PLT 212  --    Basic Metabolic Panel:  Recent Labs  Lab 06/13/21 1555 06/13/21 1628  NA 135 136  K 4.2 4.1  CL 105 105  CO2 22  --   GLUCOSE 108* 101*  BUN 20 24*  CREATININE 1.39* 1.30*  CALCIUM 8.6*  --    Lipid Panel:  Recent Labs  Lab 06/14/21 0540  CHOL 118  TRIG 92  HDL 35*  CHOLHDL 3.4  VLDL 18  LDLCALC 65   HgbA1c:  Recent Labs  Lab 06/13/21 2007  HGBA1C 5.9*   Urine Drug Screen:  Recent Labs  Lab 06/13/21 1657  LABOPIA NONE DETECTED  COCAINSCRNUR NONE DETECTED  LABBENZ NONE DETECTED  AMPHETMU NONE DETECTED  THCU NONE DETECTED  LABBARB NONE DETECTED    Alcohol Level No results for input(s): ETH in the last 168 hours.  IMAGING past 24 hours CT ANGIO HEAD W OR WO CONTRAST  Result Date: 06/13/2021 CLINICAL DATA:  Neuro deficit, stroke suspected EXAM: CT ANGIOGRAPHY HEAD AND NECK CT PERFUSION BRAIN TECHNIQUE: Multidetector CT imaging of the head and neck was performed using the standard protocol during bolus administration of intravenous contrast. Multiplanar CT image reconstructions and MIPs were obtained to evaluate the vascular anatomy. Carotid stenosis measurements (when applicable) are obtained utilizing NASCET criteria, using the distal internal carotid diameter as the denominator.  Multiphase CT imaging of the brain was performed following IV bolus contrast injection. Subsequent parametric perfusion maps were calculated using RAPID software. RADIATION DOSE REDUCTION: This exam was performed according to the departmental dose-optimization program which includes automated exposure control, adjustment of the mA and/or kV according to patient size and/or use of iterative reconstruction technique. CONTRAST:  166m OMNIPAQUE IOHEXOL 350 MG/ML SOLN COMPARISON:  CT head 03/30/2018 FINDINGS: CT HEAD FINDINGS Brain: There is hypodensity with loss of gray-white differentiation in the left frontal lobe consistent with acute infarct. There is no associated hemorrhagic transformation. There is no acute intracranial hemorrhage or extra-axial fluid collection. Background parenchymal volume is normal. The ventricles are normal in size. There is no solid mass lesion. There is no mass effect or midline shift. Vascular: There is new calcification in the left sylvian fissure consistent with a calcific embolus. The vasculature is assessed in full below. Skull: Normal. Negative for fracture or focal lesion. Sinuses/Orbits: Paranasal sinuses are clear. Bilateral lens implants are in place. The globes and orbits are otherwise unremarkable. Other: None. AS[facility]S [facility] - Ganglionic level infarction (caudate, lentiform nuclei, internal capsule, insula, M1-M3 cortex): 7 - Supraganglionic infarction (M4-M6 cortex): 2 Total score (0-10 with 10  being normal): 9 Review of the MIP images confirms the above findings CTA NECK FINDINGS Aortic arch: There is calcified atherosclerotic plaque in the imaged aortic arch. The origins of the major branch vessels are patent. There is mixed plaque in the proximal left subclavian artery resulting in mild stenosis prior to the origin of the left vertebral artery. The subclavian arteries are otherwise patent to the level imaged. Right carotid system: The  right common carotid artery is patent. There is calcified plaque at the bifurcation and proximal right internal carotid artery resulting in less than 50% stenosis. The distal right internal carotid artery is patent. The right external carotid artery is patent. There is no evidence of dissection or aneurysm. Left carotid system: The left common carotid artery is patent. There is predominantly calcified plaque in the proximal left internal carotid artery resulting in up to approximately 40-50% stenosis. The distal left internal carotid artery is patent. The left external carotid artery is patent. There is no evidence of dissection or aneurysm. Vertebral arteries: There is severe stenosis at the origin of the left vertebral artery. The left vertebral artery is otherwise patent throughout the neck. There is moderate stenosis of the right V1 segment after the origin. The remainder of the right vertebral artery is patent. Skeleton: There is multilevel degenerative change of the cervical spine. There is no acute osseous abnormality or suspicious osseous lesion. There is no visible canal hematoma. Other neck: There is a 2.7 cm left thyroid nodule. The soft tissues of the neck are otherwise unremarkable. Upper chest: The imaged lung apices are clear. There is debris in the upper trachea. Review of the MIP images confirms the above findings CTA HEAD FINDINGS Anterior circulation: There is calcified plaque in the intracranial ICAs resulting in up to mild stenosis bilaterally. The left M1 segment is patent. There is occlusion of a superior left M2 branch in the sylvian fissure at the site of calcification seen on the initial noncontrast head CT (5-40, 12-22). The right MCA is patent. The bilateral MCAs are patent. There is no aneurysm or AVM. Posterior circulation: The bilateral V4 segments are patent. PICA is identified bilaterally. The basilar artery is patent. The bilateral PCAs are patent. The posterior communicating  arteries are not definitely seen. There is no aneurysm or AVM. Venous sinuses: Patent. Anatomic variants: None. Review of the MIP images confirms the above findings CT Brain Perfusion Findings: ASPECTS: 9 CBF (<30%) Volume: 75m Perfusion (Tmax>6.0s) volume: 378mMismatch Volume: 32m332mnfarction Location:No infarct core is identified by the perfusion software. The 3 cc penumbra is in the left MCA distribution. IMPRESSION: 1. 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 ASPECTS score equals 9. 2. No infarct core identified on the perfusion software. 3 cc ischemic penumbra is identified in the left MCA distribution, possibly underestimated given appearance on the noncontrast CT. 3. 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 4. Calcified plaque at the bilateral carotid bifurcations resulting in approximately 40-50% stenosis on the left and no hemodynamically significant stenosis on the right. 5. 2.7 cm left thyroid nodule. Recommend further evaluation with thyroid ultrasound as clinically indicated given patient's age, if not already performed elsewhere. 6. Debris in the trachea. Correlate with any signs or symptoms of aspiration. These results were called by telephone at the time of interpretation on 06/13/2021 at 4:40 pm to provider Dr AroRory Percy  who verbally acknowledged these results. Electronically Signed   By: Valetta Mole M.D.   On: 06/13/2021 17:02   CT HEAD WO CONTRAST  Result Date: 06/13/2021 CLINICAL DATA:  Neuro deficit, stroke suspected EXAM: CT ANGIOGRAPHY HEAD AND NECK CT PERFUSION BRAIN TECHNIQUE: Multidetector CT imaging of the head and neck was performed using the standard protocol during bolus administration of intravenous contrast. Multiplanar CT image reconstructions and MIPs were obtained to evaluate the vascular anatomy. Carotid stenosis measurements  (when applicable) are obtained utilizing NASCET criteria, using the distal internal carotid diameter as the denominator. Multiphase CT imaging of the brain was performed following IV bolus contrast injection. Subsequent parametric perfusion maps were calculated using RAPID software. RADIATION DOSE REDUCTION: This exam was performed according to the departmental dose-optimization program which includes automated exposure control, adjustment of the mA and/or kV according to patient size and/or use of iterative reconstruction technique. CONTRAST:  172m OMNIPAQUE IOHEXOL 350 MG/ML SOLN COMPARISON:  CT head 03/30/2018 FINDINGS: CT HEAD FINDINGS Brain: There is hypodensity with loss of gray-white differentiation in the left frontal lobe consistent with acute infarct. There is no associated hemorrhagic transformation. There is no acute intracranial hemorrhage or extra-axial fluid collection. Background parenchymal volume is normal. The ventricles are normal in size. There is no solid mass lesion. There is no mass effect or midline shift. Vascular: There is new calcification in the left sylvian fissure consistent with a calcific embolus. The vasculature is assessed in full below. Skull: Normal. Negative for fracture or focal lesion. Sinuses/Orbits: Paranasal sinuses are clear. Bilateral lens implants are in place. The globes and orbits are otherwise unremarkable. Other: None. ASPECTS ([facility] - Ganglionic level infarction (caudate, lentiform nuclei, internal capsule, insula, M1-M3 cortex): 7 - Supraganglionic infarction (M4-M6 cortex): 2 Total score (0-10 with 10 being normal): 9 Review of the MIP images confirms the above findings CTA NECK FINDINGS Aortic arch: There is calcified atherosclerotic plaque in the imaged aortic arch. The origins of the major branch vessels are patent. There is mixed plaque in the proximal left subclavian artery resulting in mild stenosis prior to the origin of  the left vertebral artery. The subclavian arteries are otherwise patent to the level imaged. Right carotid system: The right common carotid artery is patent. There is calcified plaque at the bifurcation and proximal right internal carotid artery resulting in less than 50% stenosis. The distal right internal carotid artery is patent. The right external carotid artery is patent. There is no evidence of dissection or aneurysm. Left carotid system: The left common carotid artery is patent. There is predominantly calcified plaque in the proximal left internal carotid artery resulting in up to approximately 40-50% stenosis. The distal left internal carotid artery is patent. The left external carotid artery is patent. There is no evidence of dissection or aneurysm. Vertebral arteries: There is severe stenosis at the origin of the left vertebral artery. The left vertebral artery is otherwise patent throughout the neck. There is moderate stenosis of the right V1 segment after the origin. The remainder of the right vertebral artery is patent. Skeleton: There is multilevel degenerative change of the cervical spine. There is no acute osseous abnormality or suspicious osseous lesion. There is no visible canal hematoma. Other neck: There is a 2.7 cm left thyroid nodule. The soft tissues of the neck are otherwise unremarkable. Upper chest: The imaged lung apices are clear. There is debris in the upper trachea. Review of the MIP images confirms the above findings  CTA HEAD FINDINGS Anterior circulation: There is calcified plaque in the intracranial ICAs resulting in up to mild stenosis bilaterally. The left M1 segment is patent. There is occlusion of a superior left M2 branch in the sylvian fissure at the site of calcification seen on the initial noncontrast head CT (5-40, 12-22). The right MCA is patent. The bilateral MCAs are patent. There is no aneurysm or AVM. Posterior circulation: The bilateral V4 segments are patent. PICA is  identified bilaterally. The basilar artery is patent. The bilateral PCAs are patent. The posterior communicating arteries are not definitely seen. There is no aneurysm or AVM. Venous sinuses: Patent. Anatomic variants: None. Review of the MIP images confirms the above findings CT Brain Perfusion Findings: ASPECTS: 9 CBF (<30%) Volume: 61m Perfusion (Tmax>6.0s) volume: 34mMismatch Volume: 2m84mnfarction Location:No infarct core is identified by the perfusion software. The 3 cc penumbra is in the left MCA distribution. IMPRESSION: 1. 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 ASPECTS score equals 9. 2. No infarct core identified on the perfusion software. 3 cc ischemic penumbra is identified in the left MCA distribution, possibly underestimated given appearance on the noncontrast CT. 3. 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 4. Calcified plaque at the bilateral carotid bifurcations resulting in approximately 40-50% stenosis on the left and no hemodynamically significant stenosis on the right. 5. 2.7 cm left thyroid nodule. Recommend further evaluation with thyroid ultrasound as clinically indicated given patient's age, if not already performed elsewhere. 6. Debris in the trachea. Correlate with any signs or symptoms of aspiration. These results were called by telephone at the time of interpretation on 06/13/2021 at 4:40 pm to provider Dr AroRory Percyho verbally acknowledged these results. Electronically Signed   By: PetValetta MoleD.   On: 06/13/2021 17:02   CT ANGIO NECK W OR WO CONTRAST  Result Date: 06/13/2021 CLINICAL DATA:  Neuro deficit, stroke suspected EXAM: CT ANGIOGRAPHY HEAD AND NECK CT PERFUSION BRAIN TECHNIQUE: Multidetector CT imaging of the head and neck was performed using the standard protocol during bolus administration of intravenous contrast.  Multiplanar CT image reconstructions and MIPs were obtained to evaluate the vascular anatomy. Carotid stenosis measurements (when applicable) are obtained utilizing NASCET criteria, using the distal internal carotid diameter as the denominator. Multiphase CT imaging of the brain was performed following IV bolus contrast injection. Subsequent parametric perfusion maps were calculated using RAPID software. RADIATION DOSE REDUCTION: This exam was performed according to the departmental dose-optimization program which includes automated exposure control, adjustment of the mA and/or kV according to patient size and/or use of iterative reconstruction technique. CONTRAST:  100m22mNIPAQUE IOHEXOL 350 MG/ML SOLN COMPARISON:  CT head 03/30/2018 FINDINGS: CT HEAD FINDINGS Brain: There is hypodensity with loss of gray-white differentiation in the left frontal lobe consistent with acute infarct. There is no associated hemorrhagic transformation. There is no acute intracranial hemorrhage or extra-axial fluid collection. Background parenchymal volume is normal. The ventricles are normal in size. There is no solid mass lesion. There is no mass effect or midline shift. Vascular: There is new calcification in the left sylvian fissure consistent with a calcific embolus. The vasculature is assessed in full below. Skull: Normal. Negative for fracture or focal lesion. Sinuses/Orbits: Paranasal sinuses are clear. Bilateral lens implants are in place. The globes and orbits are otherwise unremarkable. Other: None. ASPECTS ([facility]  Early CT Score) - Ganglionic level infarction (caudate, lentiform nuclei, internal capsule, insula, M1-M3 cortex): 7 - Supraganglionic infarction (M4-M6 cortex): 2 Total score (0-10 with 10 being normal): 9 Review of the MIP images confirms the above findings CTA NECK FINDINGS Aortic arch: There is calcified atherosclerotic plaque in the imaged aortic arch. The origins of the major branch vessels  are patent. There is mixed plaque in the proximal left subclavian artery resulting in mild stenosis prior to the origin of the left vertebral artery. The subclavian arteries are otherwise patent to the level imaged. Right carotid system: The right common carotid artery is patent. There is calcified plaque at the bifurcation and proximal right internal carotid artery resulting in less than 50% stenosis. The distal right internal carotid artery is patent. The right external carotid artery is patent. There is no evidence of dissection or aneurysm. Left carotid system: The left common carotid artery is patent. There is predominantly calcified plaque in the proximal left internal carotid artery resulting in up to approximately 40-50% stenosis. The distal left internal carotid artery is patent. The left external carotid artery is patent. There is no evidence of dissection or aneurysm. Vertebral arteries: There is severe stenosis at the origin of the left vertebral artery. The left vertebral artery is otherwise patent throughout the neck. There is moderate stenosis of the right V1 segment after the origin. The remainder of the right vertebral artery is patent. Skeleton: There is multilevel degenerative change of the cervical spine. There is no acute osseous abnormality or suspicious osseous lesion. There is no visible canal hematoma. Other neck: There is a 2.7 cm left thyroid nodule. The soft tissues of the neck are otherwise unremarkable. Upper chest: The imaged lung apices are clear. There is debris in the upper trachea. Review of the MIP images confirms the above findings CTA HEAD FINDINGS Anterior circulation: There is calcified plaque in the intracranial ICAs resulting in up to mild stenosis bilaterally. The left M1 segment is patent. There is occlusion of a superior left M2 branch in the sylvian fissure at the site of calcification seen on the initial noncontrast head CT (5-40, 12-22). The right MCA is patent. The  bilateral MCAs are patent. There is no aneurysm or AVM. Posterior circulation: The bilateral V4 segments are patent. PICA is identified bilaterally. The basilar artery is patent. The bilateral PCAs are patent. The posterior communicating arteries are not definitely seen. There is no aneurysm or AVM. Venous sinuses: Patent. Anatomic variants: None. Review of the MIP images confirms the above findings CT Brain Perfusion Findings: ASPECTS: 9 CBF (<30%) Volume: 65m Perfusion (Tmax>6.0s) volume: 357mMismatch Volume: 4m32mnfarction Location:No infarct core is identified by the perfusion software. The 3 cc penumbra is in the left MCA distribution. IMPRESSION: 1. 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 ASPECTS score equals 9. 2. No infarct core identified on the perfusion software. 3 cc ischemic penumbra is identified in the left MCA distribution, possibly underestimated given appearance on the noncontrast CT. 3. 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 4. Calcified plaque at the bilateral carotid bifurcations resulting in approximately 40-50% stenosis on the left and no hemodynamically significant stenosis on the right. 5. 2.7 cm left thyroid nodule. Recommend further evaluation with thyroid ultrasound as clinically indicated given patient's age, if not already performed elsewhere. 6. Debris in the trachea. Correlate  with any signs or symptoms of aspiration. These results were called by telephone at the time of interpretation on 06/13/2021 at 4:40 pm to provider Dr Rory Percy, who verbally acknowledged these results. Electronically Signed   By: Valetta Mole M.D.   On: 06/13/2021 17:02   CT CEREBRAL PERFUSION W CONTRAST  Result Date: 06/13/2021 CLINICAL DATA:  Neuro deficit, stroke suspected EXAM: CT ANGIOGRAPHY HEAD AND NECK CT PERFUSION BRAIN TECHNIQUE: Multidetector  CT imaging of the head and neck was performed using the standard protocol during bolus administration of intravenous contrast. Multiplanar CT image reconstructions and MIPs were obtained to evaluate the vascular anatomy. Carotid stenosis measurements (when applicable) are obtained utilizing NASCET criteria, using the distal internal carotid diameter as the denominator. Multiphase CT imaging of the brain was performed following IV bolus contrast injection. Subsequent parametric perfusion maps were calculated using RAPID software. RADIATION DOSE REDUCTION: This exam was performed according to the departmental dose-optimization program which includes automated exposure control, adjustment of the mA and/or kV according to patient size and/or use of iterative reconstruction technique. CONTRAST:  171m OMNIPAQUE IOHEXOL 350 MG/ML SOLN COMPARISON:  CT head 03/30/2018 FINDINGS: CT HEAD FINDINGS Brain: There is hypodensity with loss of gray-white differentiation in the left frontal lobe consistent with acute infarct. There is no associated hemorrhagic transformation. There is no acute intracranial hemorrhage or extra-axial fluid collection. Background parenchymal volume is normal. The ventricles are normal in size. There is no solid mass lesion. There is no mass effect or midline shift. Vascular: There is new calcification in the left sylvian fissure consistent with a calcific embolus. The vasculature is assessed in full below. Skull: Normal. Negative for fracture or focal lesion. Sinuses/Orbits: Paranasal sinuses are clear. Bilateral lens implants are in place. The globes and orbits are otherwise unremarkable. Other: None. ASPECTS ([facility]) - Ganglionic level infarction (caudate, lentiform nuclei, internal capsule, insula, M1-M3 cortex): 7 - Supraganglionic infarction (M4-M6 cortex): 2 Total score (0-10 with 10 being normal): 9 Review of the MIP images confirms the above findings CTA NECK  FINDINGS Aortic arch: There is calcified atherosclerotic plaque in the imaged aortic arch. The origins of the major branch vessels are patent. There is mixed plaque in the proximal left subclavian artery resulting in mild stenosis prior to the origin of the left vertebral artery. The subclavian arteries are otherwise patent to the level imaged. Right carotid system: The right common carotid artery is patent. There is calcified plaque at the bifurcation and proximal right internal carotid artery resulting in less than 50% stenosis. The distal right internal carotid artery is patent. The right external carotid artery is patent. There is no evidence of dissection or aneurysm. Left carotid system: The left common carotid artery is patent. There is predominantly calcified plaque in the proximal left internal carotid artery resulting in up to approximately 40-50% stenosis. The distal left internal carotid artery is patent. The left external carotid artery is patent. There is no evidence of dissection or aneurysm. Vertebral arteries: There is severe stenosis at the origin of the left vertebral artery. The left vertebral artery is otherwise patent throughout the neck. There is moderate stenosis of the right V1 segment after the origin. The remainder of the right vertebral artery is patent. Skeleton: There is multilevel degenerative change of the cervical spine. There is no acute osseous abnormality or suspicious osseous lesion. There is no visible canal hematoma. Other neck: There is a 2.7 cm left thyroid nodule. The soft tissues of the  neck are otherwise unremarkable. Upper chest: The imaged lung apices are clear. There is debris in the upper trachea. Review of the MIP images confirms the above findings CTA HEAD FINDINGS Anterior circulation: There is calcified plaque in the intracranial ICAs resulting in up to mild stenosis bilaterally. The left M1 segment is patent. There is occlusion of a superior left M2 branch in the  sylvian fissure at the site of calcification seen on the initial noncontrast head CT (5-40, 12-22). The right MCA is patent. The bilateral MCAs are patent. There is no aneurysm or AVM. Posterior circulation: The bilateral V4 segments are patent. PICA is identified bilaterally. The basilar artery is patent. The bilateral PCAs are patent. The posterior communicating arteries are not definitely seen. There is no aneurysm or AVM. Venous sinuses: Patent. Anatomic variants: None. Review of the MIP images confirms the above findings CT Brain Perfusion Findings: ASPECTS: 9 CBF (<30%) Volume: 32m Perfusion (Tmax>6.0s) volume: 3863mMismatch Volume: 63m28mnfarction Location:No infarct core is identified by the perfusion software. The 3 cc penumbra is in the left MCA distribution. IMPRESSION: 1. 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 ASPECTS score equals 9. 2. No infarct core identified on the perfusion software. 3 cc ischemic penumbra is identified in the left MCA distribution, possibly underestimated given appearance on the noncontrast CT. 3. 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 4. Calcified plaque at the bilateral carotid bifurcations resulting in approximately 40-50% stenosis on the left and no hemodynamically significant stenosis on the right. 5. 2.7 cm left thyroid nodule. Recommend further evaluation with thyroid ultrasound as clinically indicated given patient's age, if not already performed elsewhere. 6. Debris in the trachea. Correlate with any signs or symptoms of aspiration. These results were called by telephone at the time of interpretation on 06/13/2021 at 4:40 pm to provider Dr AroRory Percyho verbally acknowledged these results. Electronically Signed   By: PetValetta MoleD.   On: 06/13/2021 17:02    PHYSICAL EXAM  Constitutional: Appears well-developed  and well-nourished.  Cardiovascular: Normal rate and regular rhythm.  Respiratory: Effort normal, non-labored breathing  Neuro - awake, alert, eyes open, partial expressive > receptive aphasia, word finding difficulty, able to follow most simple commands but not all of them. Still has difficulty with finger counting, finger naming. No gaze palsy, tracking bilaterally, blinking to visual threat bilaterally. Right facial droop on facial movement. Tongue midline. Bilateral UEs 5/5, no drift. Bilaterally LEs 5/5, no drift. Sensation symmetrical bilaterally, b/l FTN intact, gait not tested.    ASSESSMENT/PLAN [name] a [age]o. male with history of hypertension, seizure on Keppra, hyperlipidemia presenting with confusion, aphasia, apraxia.  TNK not given due to outside window.  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 CTA head & neck Occluded left superior M2 branch due to a calcific embolus with corresponding acute infarct in the MCA distribution. 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  Therapy recommendations: Outpatient  OT and speech Disposition: Pending  Carotid stenosis CTA head and neck Calcified plaque at the bilateral carotid bifurcations resulting in approximately 40-50% stenosis on the left  Outpt follow up with [facility] No intervention needed at this time.  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 After hours, contact General Neurology

## 2021-06-14 NOTE — Evaluation (Signed)
Occupational Therapy Evaluation Patient Details Name: Jay Howard MRN: [id number] DOB: Mar 21, 1940 Today's Date: 06/14/2021   History of Present Illness pt is an 81 y/o male presenting 5/31 with trouble speaking x 1 day.  CT showing L MCA infarct with superior M2 branch occluded.  PMHx:  CA, HTN, seizures   Clinical Impression   PTA, pt was living with his wife and was independent with ADLs per pt reports; unsure of accuracy of home info and PLOF due to expressive deficits.  Pt currently performing ADLs and functional mobility with Min Guard A level. Pt presenting with decreased processing, problem solving, and following of commands. Pt would benefit from further acute OT to facilitate safe dc. Recommend dc to home with follow up at OP for further OT to address cognition and optimize safety, independence with ADLs, and return to PLOF.      Recommendations for follow up therapy are one component of a multi-disciplinary discharge planning process, led by the attending physician.  Recommendations may be updated based on patient status, additional functional criteria and insurance authorization.   Follow Up Recommendations  Outpatient OT    Assistance Recommended at Discharge Frequent or constant Supervision/Assistance  Patient can return home with the following      Functional Status Assessment  Patient has had a recent decline in their functional status and demonstrates the ability to make significant improvements in function in a reasonable and predictable amount of time.  Equipment Recommendations  None recommended by OT    Recommendations for Other Services Speech consult     Precautions / Restrictions Precautions Precautions: None (lower fall risk) Restrictions Weight Bearing Restrictions: No      Mobility Bed Mobility Overal bed mobility: Needs Assistance Bed Mobility: Supine to Sit     Supine to sit: Min guard     General bed mobility comments: Increased time and  effort    Transfers Overall transfer level: Needs assistance Equipment used: None Transfers: Sit to/from Stand Sit to Stand: Min guard           General transfer comment: Min Guard A for safety      Balance Overall balance assessment: Needs assistance Sitting-balance support: No upper extremity supported, Feet supported Sitting balance-Leahy Scale: Good     Standing balance support: No upper extremity supported, During functional activity Standing balance-Leahy Scale: Good                             ADL either performed or assessed with clinical judgement   ADL Overall ADL's : Needs assistance/impaired Eating/Feeding: Set up;Supervision/ safety;Sitting   Grooming: Wash/dry face;Min guard;Standing   Upper Body Bathing: Set up;Supervision/ safety;Sitting   Lower Body Bathing: Min guard;Sit to/from stand   Upper Body Dressing : Supervision/safety;Set up;Sitting   Lower Body Dressing: Min guard;Sit to/from stand   Toilet Transfer: Min guard;Ambulation (simulated)           Functional mobility during ADLs: Min guard General ADL Comments: Pt presenting with slow processing, decreased insight, and expressive deficits. Able to perform grooming at sink and mobility in hallway with MIn Guard A demonstrating low fall risk.     Vision Baseline Vision/History: 1 Wears glasses Patient Visual Report: Blurring of vision (Unclear due to speech difficulties)       Perception     Praxis      Pertinent Vitals/Pain Pain Assessment Pain Assessment: Faces Faces Pain Scale: No hurt Pain Intervention(s): Monitored during session  Hand Dominance Left   Extremity/Trunk Assessment Upper Extremity Assessment Upper Extremity Assessment: Defer to OT evaluation   Lower Extremity Assessment Lower Extremity Assessment: Overall WFL for tasks assessed   Cervical / Trunk Assessment Cervical / Trunk Assessment: Normal   Communication  Communication Communication: Expressive difficulties   Cognition Arousal/Alertness: Awake/alert Behavior During Therapy: Flat affect Overall Cognitive Status: Difficult to assess Area of Impairment: Problem solving, Awareness, Safety/judgement, Following commands                       Following Commands: Follows one step commands inconsistently, Follows one step commands with increased time Safety/Judgement: Decreased awareness of deficits Awareness: Intellectual Problem Solving: Slow processing General Comments: Difficulty to fully assess due to expressive deficits. Pt able to answer yes/no questions (inconsistently but able to recognize errors and change answer).     General Comments       Exercises     Shoulder Instructions      Home Living Family/patient expects to be discharged to:: Private residence Living Arrangements: Spouse/significant other Available Help at Discharge: Family Type of Home: House Home Access: Stairs to enter [employer] of Steps: Unsure how many   Home Layout: Multi-level;Able to live on main level with bedroom/bathroom               Home Equipment: Rolling Leever (2 wheels)   Additional Comments: Unsure of accuracy of information as pt with expressive difficulties      Prior Functioning/Environment Prior Level of Function : Independent/Modified Independent               ADLs Comments: Pt reporting he performs ADLs and wife assists with IADLs        OT Problem List: Decreased strength;Decreased range of motion;Decreased activity tolerance;Impaired balance (sitting and/or standing);Decreased cognition;Decreased knowledge of use of DME or AE;Decreased knowledge of precautions      OT Treatment/Interventions: Self-care/ADL training;Therapeutic exercise;Energy conservation;DME and/or AE instruction;Therapeutic activities;Cognitive remediation/compensation;Patient/family education    OT Goals(Current goals can be  found in the care plan section) Acute Rehab OT Goals Patient Stated Goal: Husband OT Goal Formulation: With patient Time For Goal Achievement: 06/28/21 Potential to Achieve Goals: Good  OT Frequency: Min 3X/week    Co-evaluation              AM-PAC OT "6 Clicks" Daily Activity     Outcome Measure Help from another person eating meals?: A Little Help from another person taking care of personal grooming?: A Little Help from another person toileting, which includes using toliet, bedpan, or urinal?: A Little Help from another person bathing (including washing, rinsing, drying)?: A Little Help from another person to put on and taking off regular upper body clothing?: A Little Help from another person to put on and taking off regular lower body clothing?: A Little 6 Click Score: 18   End of Session Equipment Utilized During Treatment: Gait belt Nurse Communication: Mobility status  Activity Tolerance: Patient tolerated treatment well Patient left: in chair;with call bell/phone within reach;with chair alarm set  OT Visit Diagnosis: Unsteadiness on feet (R26.81);Other abnormalities of gait and mobility (R26.89);Muscle weakness (generalized) (M62.81);Other symptoms and signs involving cognitive function                Time: 1029-1050 OT Time Calculation (min): 21 min Charges:  OT General Charges $OT Visit: 1 Visit OT Evaluation $OT Eval Moderate Complexity: [place], OTR/L Acute Rehab Pager: [phone] Office: [phone]  Heywood Footman Deboraha Goar 06/14/2021, 12:46 PM

## 2021-06-15 LAB — CBC
HCT: 34.8 % — ABNORMAL LOW (ref 39.0–52.0)
Hemoglobin: 11.7 g/dL — ABNORMAL LOW (ref 13.0–17.0)
MCH: 29.5 pg (ref 26.0–34.0)
MCHC: 33.6 g/dL (ref 30.0–36.0)
MCV: 87.7 fL (ref 80.0–100.0)
Platelets: 157 10*3/uL (ref 150–400)
RBC: 3.97 MIL/uL — ABNORMAL LOW (ref 4.22–5.81)
RDW: 14.5 % (ref 11.5–15.5)
WBC: 4.8 10*3/uL (ref 4.0–10.5)
nRBC: 0 % (ref 0.0–0.2)

## 2021-06-15 LAB — BASIC METABOLIC PANEL
Anion gap: 8 (ref 5–15)
BUN: 17 mg/dL (ref 8–23)
CO2: 21 mmol/L — ABNORMAL LOW (ref 22–32)
Calcium: 8.5 mg/dL — ABNORMAL LOW (ref 8.9–10.3)
Chloride: 111 mmol/L (ref 98–111)
Creatinine, Ser: 1.22 mg/dL (ref 0.61–1.24)
GFR, Estimated: 60 mL/min — ABNORMAL LOW (ref >60)
Glucose, Bld: 90 mg/dL (ref 70–99)
Potassium: 3.6 mmol/L (ref 3.5–5.1)
Sodium: 140 mmol/L (ref 135–145)

## 2021-06-15 LAB — MAGNESIUM: Magnesium: 1.7 mg/dL (ref 1.7–2.4)

## 2021-06-15 MED ORDER — CLOPIDOGREL BISULFATE 75 MG PO TABS: 75.0000 mg | ORAL_TABLET | Freq: Every day | ORAL | 0 refills | Status: AC

## 2021-06-15 MED ORDER — ASPIRIN 325 MG PO TBEC
325.0000 mg | DELAYED_RELEASE_TABLET | Freq: Every day | ORAL | 3 refills | Status: AC
Start: 2021-06-16 — End: 2022-06-11

## 2021-06-15 MED ORDER — ATORVASTATIN CALCIUM 10 MG PO TABS: 10.0000 mg | ORAL_TABLET | Freq: Every day | ORAL | 2 refills | Status: AC

## 2021-06-15 NOTE — Discharge Summary (Addendum)
Physician Discharge Summary   Patient: Jay Howard MRN: [id number] DOB: 03/24/1940  Admit date:     06/13/2021  Discharge date: 06/15/21  Discharge Physician: Flora Lipps   PCP: Burman Freestone, MD   Recommendations at discharge:   Follow-up with your primary care provider in 1 week.  Check CBC BMP magnesium in the next visit. Follow-up with [facility] in 4 to 6 weeks.  Office to schedule an appointment. Patient has been started on full dose aspirin and Plavix for 3 months and will need to continue aspirin full dose after 3 months  Discharge Diagnoses: Principal Problem:   Acute ischemic stroke [facility]) Active Problems:   HTN (hypertension), malignant   Seizures ([place])  Resolved Problems:   * No resolved hospital problems. *  Hospital Course:  Jay Howard is a 81 y.o. male with past medical history of hypertension, seizure disorder, prostate cancer status post prostatectomy, hyperlipidemia presented to the hospital with trouble with speech for 1 day with difficulty forming sentences and slurred speech.  In the ED, patient had elevated blood pressure more than 573 systolic.  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  CT angiogram occluded left superior M2 branch due to calcified embolus corresponding with acute infarct in the MCA distribution.  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  Patient was then admitted hospital for further evaluation and treatment.  Assessment and plan Principal Problem:   Acute ischemic stroke [facility]) Active Problems:   HTN (hypertension), malignant   Seizures ([place])   Acute expressive dysphagia secondary to left MCA/ACA/right ACA/ MCA  infarcts Patient was out of the tPA window.  According to CT angiogram there appeared to be calcified embolus blocking the left M2 branch of left MCA, with  left ICA atherosclerotic disease.  Likely atherosclerotic carotid disease  causing infarcts.  Patient with no history of atrial fibrillation.  Neurology on board and recommend aspirin and Plavix for 3 months followed by aspirin alone.  Dose of aspirin will be 325 mg daily.  MRI of the brain showed numerous infarcts in bilateral cerebral hemispheres.  2D echocardiogram showed LV ejection fraction of 60 to 65% with grade 1 diastolic dysfunction.  Patient was seen by physical therapy and Occupational Therapy who recommended home OT .  Has been seen by speech therapy for aphasia and recommended outpatient speech and linguistic therapy.  Lipid profile with HDL of 35.  Hemoglobin A1c at 5.9.  Urine drug screen was negative.  Urinalysis was negative.  On aspirin full dose and Plavix at this time. Neurology has seen the patient for follow-up and recommend aspirin and Plavix for 3 months followed by aspirin alone.  Was on Lipitor 5 mg mg at home will be changed to 10 mg on discharge.    HTN, accelerated on presentation. Patient will resume antihypertensives from home on discharge.  Was initially on permissive hypertension.  Eventual goal is normotension.  Patient is on Coreg, spironolactone and hydralazine at home.   Seizure disorder Chronic and stable.  Continue Keppra 750 mg twice daily from home.  Possible aspiration.  As per imaging.  No clinical signs.  Consultants: Neurology  Procedures performed: None  Disposition: Home with home health PT.  Was unable to reach the patient's wife despite multiple attempts.  Diet recommendation:  Discharge Diet Orders (From admission, onward)     Start     Ordered   06/15/21 0000  Diet - low  sodium heart healthy        06/15/21 1406           Cardiac diet DISCHARGE MEDICATION: Allergies as of 06/15/2021       Reactions   Amlodipine Rash, Swelling   Hydrochlorothiazide Rash   Lisinopril Rash   Quinapril Rash        Medication List     TAKE these medications    acetaminophen 500 MG tablet Commonly known as:  TYLENOL Take 500 mg by mouth every 6 (six) hours as needed for mild pain.   aspirin EC 325 MG tablet Take 1 tablet (325 mg total) by mouth daily. Start taking on: June 16, 2021   atorvastatin 10 MG tablet Commonly known as: LIPITOR Take 1 tablet (10 mg total) by mouth daily. What changed: how much to take   carvedilol 12.5 MG tablet Commonly known as: COREG Take 12.5 mg by mouth 2 (two) times daily with a meal.   clopidogrel 75 MG tablet Commonly known as: PLAVIX Take 1 tablet (75 mg total) by mouth daily. Start taking on: June 16, 2021   hydrALAZINE 25 MG tablet Commonly known as: APRESOLINE Take 25 mg by mouth in the morning and at bedtime.   levETIRAcetam 750 MG tablet Commonly known as: KEPPRA Take 750 mg by mouth 2 (two) times daily.   losartan 100 MG tablet Commonly known as: COZAAR Take 100 mg by mouth daily.   magnesium oxide 400 MG tablet Commonly known as: MAG-OX Take 400 mg by mouth daily.   pantoprazole 40 MG tablet Commonly known as: PROTONIX Take 40 mg by mouth daily.   spironolactone 25 MG tablet Commonly known as: ALDACTONE Take 25 mg by mouth daily.   thiamine 100 MG tablet Commonly known as: Vitamin B-1 Take 100 mg by mouth daily.   Vitamin D3 25 MCG (1000 UT) Caps Take 1 capsule by mouth daily.        Follow-up Information     [facility]. Schedule an appointment as soon as possible for a visit in 1 month(s).   Specialty: Neurology Why: stroke clinic Contact information: [address] [place] [phone]               Subjective: Today, patient was seen and examined at bedside.  Denies headache dizziness lightheadedness, has expressive aphasia.  Ambulated with physical therapy.  Discharge Exam: Filed Weights   06/13/21 1537  Weight: 77.1 kg   Vitals:   06/15/21 0759 06/15/21 1148  BP: (!) 171/85 (!) 178/72  Pulse: 71 69  Resp:  20  Temp: 97.8 F (36.6 C)  98.1 F (36.7 C)  SpO2: 98% 100%   Body mass index is 26.63 kg/m.   General:  Average built, not in obvious distress, elderly male, communicative HENT:   No scleral pallor or icterus noted. Oral mucosa is moist.  Chest:  Clear breath sounds.  Diminished breath sounds bilaterally. No crackles or wheezes.  CVS: S1 &S2 heard. No murmur.  Regular rate and rhythm. Abdomen: Soft, nontender, nondistended.  Bowel sounds are heard.   Extremities: No cyanosis, clubbing or edema.  Peripheral pulses are palpable. Psych: Alert, awake and oriented, normal mood CNS: Mild expressive aphasia.  Power equal in all extremities.   Skin: Warm and dry.  No rashes noted.   Condition at discharge: good  The results of significant diagnostics from this hospitalization (including imaging, microbiology, ancillary and laboratory) are listed below for reference.  Imaging Studies: CT ANGIO HEAD W OR WO CONTRAST  Result Date: 06/13/2021 CLINICAL DATA:  Neuro deficit, stroke suspected EXAM: CT ANGIOGRAPHY HEAD AND NECK CT PERFUSION BRAIN TECHNIQUE: Multidetector CT imaging of the head and neck was performed using the standard protocol during bolus administration of intravenous contrast. Multiplanar CT image reconstructions and MIPs were obtained to evaluate the vascular anatomy. Carotid stenosis measurements (when applicable) are obtained utilizing NASCET criteria, using the distal internal carotid diameter as the denominator. Multiphase CT imaging of the brain was performed following IV bolus contrast injection. Subsequent parametric perfusion maps were calculated using RAPID software. RADIATION DOSE REDUCTION: This exam was performed according to the departmental dose-optimization program which includes automated exposure control, adjustment of the mA and/or kV according to patient size and/or use of iterative reconstruction technique. CONTRAST:  13m OMNIPAQUE IOHEXOL 350 MG/ML SOLN COMPARISON:  CT head 03/30/2018  FINDINGS: CT HEAD FINDINGS Brain: There is hypodensity with loss of gray-white differentiation in the left frontal lobe consistent with acute infarct. There is no associated hemorrhagic transformation. There is no acute intracranial hemorrhage or extra-axial fluid collection. Background parenchymal volume is normal. The ventricles are normal in size. There is no solid mass lesion. There is no mass effect or midline shift. Vascular: There is new calcification in the left sylvian fissure consistent with a calcific embolus. The vasculature is assessed in full below. Skull: Normal. Negative for fracture or focal lesion. Sinuses/Orbits: Paranasal sinuses are clear. Bilateral lens implants are in place. The globes and orbits are otherwise unremarkable. Other: None. ASPECTS ([facility] Score) - Ganglionic level infarction (caudate, lentiform nuclei, internal capsule, insula, M1-M3 cortex): 7 - Supraganglionic infarction (M4-M6 cortex): 2 Total score (0-10 with 10 being normal): 9 Review of the MIP images confirms the above findings CTA NECK FINDINGS Aortic arch: There is calcified atherosclerotic plaque in the imaged aortic arch. The origins of the major branch vessels are patent. There is mixed plaque in the proximal left subclavian artery resulting in mild stenosis prior to the origin of the left vertebral artery. The subclavian arteries are otherwise patent to the level imaged. Right carotid system: The right common carotid artery is patent. There is calcified plaque at the bifurcation and proximal right internal carotid artery resulting in less than 50% stenosis. The distal right internal carotid artery is patent. The right external carotid artery is patent. There is no evidence of dissection or aneurysm. Left carotid system: The left common carotid artery is patent. There is predominantly calcified plaque in the proximal left internal carotid artery resulting in up to approximately 40-50%  stenosis. The distal left internal carotid artery is patent. The left external carotid artery is patent. There is no evidence of dissection or aneurysm. Vertebral arteries: There is severe stenosis at the origin of the left vertebral artery. The left vertebral artery is otherwise patent throughout the neck. There is moderate stenosis of the right V1 segment after the origin. The remainder of the right vertebral artery is patent. Skeleton: There is multilevel degenerative change of the cervical spine. There is no acute osseous abnormality or suspicious osseous lesion. There is no visible canal hematoma. Other neck: There is a 2.7 cm left thyroid nodule. The soft tissues of the neck are otherwise unremarkable. Upper chest: The imaged lung apices are clear. There is debris in the upper trachea. Review of the MIP images confirms the above findings CTA HEAD FINDINGS Anterior circulation: There is calcified plaque in the intracranial ICAs resulting in up  to mild stenosis bilaterally. The left M1 segment is patent. There is occlusion of a superior left M2 branch in the sylvian fissure at the site of calcification seen on the initial noncontrast head CT (5-40, 12-22). The right MCA is patent. The bilateral MCAs are patent. There is no aneurysm or AVM. Posterior circulation: The bilateral V4 segments are patent. PICA is identified bilaterally. The basilar artery is patent. The bilateral PCAs are patent. The posterior communicating arteries are not definitely seen. There is no aneurysm or AVM. Venous sinuses: Patent. Anatomic variants: None. Review of the MIP images confirms the above findings CT Brain Perfusion Findings: ASPECTS: 9 CBF (<30%) Volume: 81m Perfusion (Tmax>6.0s) volume: 34mMismatch Volume: 48m41mnfarction Location:No infarct core is identified by the perfusion software. The 3 cc penumbra is in the left MCA distribution. IMPRESSION: 1. Occluded left superior M2 branch due to a calcific embolus with corresponding  acute infarct in the MCA distribution. ASPECTS score equals 9. 2. No infarct core identified on the perfusion software. 3 cc ischemic penumbra is identified in the left MCA distribution, possibly underestimated given appearance on the noncontrast CT. 3. 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 4. Calcified plaque at the bilateral carotid bifurcations resulting in approximately 40-50% stenosis on the left and no hemodynamically significant stenosis on the right. 5. 2.7 cm left thyroid nodule. Recommend further evaluation with thyroid ultrasound as clinically indicated given patient's age, if not already performed elsewhere. 6. Debris in the trachea. Correlate with any signs or symptoms of aspiration. These results were called by telephone at the time of interpretation on 06/13/2021 at 4:40 pm to provider Dr AroRory Percyho verbally acknowledged these results. Electronically Signed   By: PetValetta MoleD.   On: 06/13/2021 17:02   CT HEAD WO CONTRAST  Result Date: 06/13/2021 CLINICAL DATA:  Neuro deficit, stroke suspected EXAM: CT ANGIOGRAPHY HEAD AND NECK CT PERFUSION BRAIN TECHNIQUE: Multidetector CT imaging of the head and neck was performed using the standard protocol during bolus administration of intravenous contrast. Multiplanar CT image reconstructions and MIPs were obtained to evaluate the vascular anatomy. Carotid stenosis measurements (when applicable) are obtained utilizing NASCET criteria, using the distal internal carotid diameter as the denominator. Multiphase CT imaging of the brain was performed following IV bolus contrast injection. Subsequent parametric perfusion maps were calculated using RAPID software. RADIATION DOSE REDUCTION: This exam was performed according to the departmental dose-optimization program which includes automated exposure control, adjustment of the mA and/or kV according to patient size and/or use of  iterative reconstruction technique. CONTRAST:  100m67mNIPAQUE IOHEXOL 350 MG/ML SOLN COMPARISON:  CT head 03/30/2018 FINDINGS: CT HEAD FINDINGS Brain: There is hypodensity with loss of gray-white differentiation in the left frontal lobe consistent with acute infarct. There is no associated hemorrhagic transformation. There is no acute intracranial hemorrhage or extra-axial fluid collection. Background parenchymal volume is normal. The ventricles are normal in size. There is no solid mass lesion. There is no mass effect or midline shift. Vascular: There is new calcification in the left sylvian fissure consistent with a calcific embolus. The vasculature is assessed in full below. Skull: Normal. Negative for fracture or focal lesion. Sinuses/Orbits: Paranasal sinuses are clear. Bilateral lens implants are in place. The globes and orbits are otherwise unremarkable. Other: None. ASPECTS ([facility] Early CT Score) - Ganglionic level infarction (caudate, lentiform nuclei, internal capsule, insula, M1-M3 cortex): 7 - Supraganglionic infarction (  M4-M6 cortex): 2 Total score (0-10 with 10 being normal): 9 Review of the MIP images confirms the above findings CTA NECK FINDINGS Aortic arch: There is calcified atherosclerotic plaque in the imaged aortic arch. The origins of the major branch vessels are patent. There is mixed plaque in the proximal left subclavian artery resulting in mild stenosis prior to the origin of the left vertebral artery. The subclavian arteries are otherwise patent to the level imaged. Right carotid system: The right common carotid artery is patent. There is calcified plaque at the bifurcation and proximal right internal carotid artery resulting in less than 50% stenosis. The distal right internal carotid artery is patent. The right external carotid artery is patent. There is no evidence of dissection or aneurysm. Left carotid system: The left common carotid artery is patent. There is  predominantly calcified plaque in the proximal left internal carotid artery resulting in up to approximately 40-50% stenosis. The distal left internal carotid artery is patent. The left external carotid artery is patent. There is no evidence of dissection or aneurysm. Vertebral arteries: There is severe stenosis at the origin of the left vertebral artery. The left vertebral artery is otherwise patent throughout the neck. There is moderate stenosis of the right V1 segment after the origin. The remainder of the right vertebral artery is patent. Skeleton: There is multilevel degenerative change of the cervical spine. There is no acute osseous abnormality or suspicious osseous lesion. There is no visible canal hematoma. Other neck: There is a 2.7 cm left thyroid nodule. The soft tissues of the neck are otherwise unremarkable. Upper chest: The imaged lung apices are clear. There is debris in the upper trachea. Review of the MIP images confirms the above findings CTA HEAD FINDINGS Anterior circulation: There is calcified plaque in the intracranial ICAs resulting in up to mild stenosis bilaterally. The left M1 segment is patent. There is occlusion of a superior left M2 branch in the sylvian fissure at the site of calcification seen on the initial noncontrast head CT (5-40, 12-22). The right MCA is patent. The bilateral MCAs are patent. There is no aneurysm or AVM. Posterior circulation: The bilateral V4 segments are patent. PICA is identified bilaterally. The basilar artery is patent. The bilateral PCAs are patent. The posterior communicating arteries are not definitely seen. There is no aneurysm or AVM. Venous sinuses: Patent. Anatomic variants: None. Review of the MIP images confirms the above findings CT Brain Perfusion Findings: ASPECTS: 9 CBF (<30%) Volume: 75m Perfusion (Tmax>6.0s) volume: 329mMismatch Volume: 38m61mnfarction Location:No infarct core is identified by the perfusion software. The 3 cc penumbra is in the  left MCA distribution. IMPRESSION: 1. 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 ASPECTS score equals 9. 2. No infarct core identified on the perfusion software. 3 cc ischemic penumbra is identified in the left MCA distribution, possibly underestimated given appearance on the noncontrast CT. 3. 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 4. Calcified plaque at the bilateral carotid bifurcations resulting in approximately 40-50% stenosis on the left and no hemodynamically significant stenosis on the right. 5. 2.7 cm left thyroid nodule. Recommend further evaluation with thyroid ultrasound as clinically indicated given patient's age, if not already performed elsewhere. 6. Debris in the trachea. Correlate with any signs or symptoms of aspiration. These results were called by telephone at the time of interpretation on  06/13/2021 at 4:40 pm to provider Dr Rory Percy, who verbally acknowledged these results. Electronically Signed   By: Valetta Mole M.D.   On: 06/13/2021 17:02   CT ANGIO NECK W OR WO CONTRAST  Result Date: 06/13/2021 CLINICAL DATA:  Neuro deficit, stroke suspected EXAM: CT ANGIOGRAPHY HEAD AND NECK CT PERFUSION BRAIN TECHNIQUE: Multidetector CT imaging of the head and neck was performed using the standard protocol during bolus administration of intravenous contrast. Multiplanar CT image reconstructions and MIPs were obtained to evaluate the vascular anatomy. Carotid stenosis measurements (when applicable) are obtained utilizing NASCET criteria, using the distal internal carotid diameter as the denominator. Multiphase CT imaging of the brain was performed following IV bolus contrast injection. Subsequent parametric perfusion maps were calculated using RAPID software. RADIATION DOSE REDUCTION: This exam was performed according to the departmental  dose-optimization program which includes automated exposure control, adjustment of the mA and/or kV according to patient size and/or use of iterative reconstruction technique. CONTRAST:  172m OMNIPAQUE IOHEXOL 350 MG/ML SOLN COMPARISON:  CT head 03/30/2018 FINDINGS: CT HEAD FINDINGS Brain: There is hypodensity with loss of gray-white differentiation in the left frontal lobe consistent with acute infarct. There is no associated hemorrhagic transformation. There is no acute intracranial hemorrhage or extra-axial fluid collection. Background parenchymal volume is normal. The ventricles are normal in size. There is no solid mass lesion. There is no mass effect or midline shift. Vascular: There is new calcification in the left sylvian fissure consistent with a calcific embolus. The vasculature is assessed in full below. Skull: Normal. Negative for fracture or focal lesion. Sinuses/Orbits: Paranasal sinuses are clear. Bilateral lens implants are in place. The globes and orbits are otherwise unremarkable. Other: None. ASPECTS ([facility] CT Score) - Ganglionic level infarction (caudate, lentiform nuclei, internal capsule, insula, M1-M3 cortex): 7 - Supraganglionic infarction (M4-M6 cortex): 2 Total score (0-10 with 10 being normal): 9 Review of the MIP images confirms the above findings CTA NECK FINDINGS Aortic arch: There is calcified atherosclerotic plaque in the imaged aortic arch. The origins of the major branch vessels are patent. There is mixed plaque in the proximal left subclavian artery resulting in mild stenosis prior to the origin of the left vertebral artery. The subclavian arteries are otherwise patent to the level imaged. Right carotid system: The right common carotid artery is patent. There is calcified plaque at the bifurcation and proximal right internal carotid artery resulting in less than 50% stenosis. The distal right internal carotid artery is patent. The right external carotid  artery is patent. There is no evidence of dissection or aneurysm. Left carotid system: The left common carotid artery is patent. There is predominantly calcified plaque in the proximal left internal carotid artery resulting in up to approximately 40-50% stenosis. The distal left internal carotid artery is patent. The left external carotid artery is patent. There is no evidence of dissection or aneurysm. Vertebral arteries: There is severe stenosis at the origin of the left vertebral artery. The left vertebral artery is otherwise patent throughout the neck. There is moderate stenosis of the right V1 segment after the origin. The remainder of the right vertebral artery is patent. Skeleton: There is multilevel degenerative change of the cervical spine. There is no acute osseous abnormality or suspicious osseous lesion. There is no visible canal hematoma. Other neck: There is a 2.7 cm left thyroid nodule. The soft tissues of the neck are otherwise unremarkable. Upper chest: The imaged lung apices are clear. There is debris in the  upper trachea. Review of the MIP images confirms the above findings CTA HEAD FINDINGS Anterior circulation: There is calcified plaque in the intracranial ICAs resulting in up to mild stenosis bilaterally. The left M1 segment is patent. There is occlusion of a superior left M2 branch in the sylvian fissure at the site of calcification seen on the initial noncontrast head CT (5-40, 12-22). The right MCA is patent. The bilateral MCAs are patent. There is no aneurysm or AVM. Posterior circulation: The bilateral V4 segments are patent. PICA is identified bilaterally. The basilar artery is patent. The bilateral PCAs are patent. The posterior communicating arteries are not definitely seen. There is no aneurysm or AVM. Venous sinuses: Patent. Anatomic variants: None. Review of the MIP images confirms the above findings CT Brain Perfusion Findings: ASPECTS: 9 CBF (<30%) Volume: 78m Perfusion (Tmax>6.0s)  volume: 385mMismatch Volume: 34m48mnfarction Location:No infarct core is identified by the perfusion software. The 3 cc penumbra is in the left MCA distribution. IMPRESSION: 1. 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 ASPECTS score equals 9. 2. No infarct core identified on the perfusion software. 3 cc ischemic penumbra is identified in the left MCA distribution, possibly underestimated given appearance on the noncontrast CT. 3. 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 4. Calcified plaque at the bilateral carotid bifurcations resulting in approximately 40-50% stenosis on the left and no hemodynamically significant stenosis on the right. 5. 2.7 cm left thyroid nodule. Recommend further evaluation with thyroid ultrasound as clinically indicated given patient's age, if not already performed elsewhere. 6. Debris in the trachea. Correlate with any signs or symptoms of aspiration. These results were called by telephone at the time of interpretation on 06/13/2021 at 4:40 pm to provider Dr AroRory Percyho verbally acknowledged these results. Electronically Signed   By: PetValetta MoleD.   On: 06/13/2021 17:02   MR BRAIN WO CONTRAST  Result Date: 06/14/2021 CLINICAL DATA:  Stroke, follow up EXAM: MRI HEAD WITHOUT CONTRAST TECHNIQUE: Multiplanar, multiecho pulse sequences of the brain and surrounding structures were obtained without intravenous contrast. COMPARISON:  Same day CT and CTA head. 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 The largest/most confluent infarct is in the high left frontal lobe. Fewer small acute infarcts in bilateral parieto-occipital regionsand single punctate infarct in the right frontal lobe. Areas of associated edema without midline shift. No  evidence of acute hemorrhage, hydrocephalus, or extra-axial fluid collection. Chronic microvascular ischemic disease. Vascular: Better assessed on same day CTA. The major arterial flow voids at the skull base are maintained. Skull and upper cervical spine: Normal marrow signal. Sinuses/Orbits: Minimal paranasal sinus mucosal thickening. No acute orbital findings. Other: Trace bilateral mastoid effusions. IMPRESSION: 1. 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 The largest/most confluent infarct is in the high left frontal lobe. 2. Fewer small acute infarcts in bilateral parieto-occipital regions and single punctate infarct in the right frontal lobe. 3. Areas of associated edema without midline shift. Electronically Signed   By: FreMargaretha SheffieldD.   On: 06/14/2021 18:18   CT CEREBRAL PERFUSION W CONTRAST  Result Date: 06/13/2021 CLINICAL DATA:  Neuro deficit, stroke suspected EXAM: CT ANGIOGRAPHY HEAD AND NECK CT PERFUSION BRAIN TECHNIQUE: Multidetector CT imaging of  the head and neck was performed using the standard protocol during bolus administration of intravenous contrast. Multiplanar CT image reconstructions and MIPs were obtained to evaluate the vascular anatomy. Carotid stenosis measurements (when applicable) are obtained utilizing NASCET criteria, using the distal internal carotid diameter as the denominator. Multiphase CT imaging of the brain was performed following IV bolus contrast injection. Subsequent parametric perfusion maps were calculated using RAPID software. RADIATION DOSE REDUCTION: This exam was performed according to the departmental dose-optimization program which includes automated exposure control, adjustment of the mA and/or kV according to patient size and/or use of iterative reconstruction technique. CONTRAST:  124m OMNIPAQUE IOHEXOL 350 MG/ML SOLN COMPARISON:  CT head 03/30/2018 FINDINGS: CT  HEAD FINDINGS Brain: There is hypodensity with loss of gray-white differentiation in the left frontal lobe consistent with acute infarct. There is no associated hemorrhagic transformation. There is no acute intracranial hemorrhage or extra-axial fluid collection. Background parenchymal volume is normal. The ventricles are normal in size. There is no solid mass lesion. There is no mass effect or midline shift. Vascular: There is new calcification in the left sylvian fissure consistent with a calcific embolus. The vasculature is assessed in full below. Skull: Normal. Negative for fracture or focal lesion. Sinuses/Orbits: Paranasal sinuses are clear. Bilateral lens implants are in place. The globes and orbits are otherwise unremarkable. Other: None. ASPECTS ([facility]) - Ganglionic level infarction (caudate, lentiform nuclei, internal capsule, insula, M1-M3 cortex): 7 - Supraganglionic infarction (M4-M6 cortex): 2 Total score (0-10 with 10 being normal): 9 Review of the MIP images confirms the above findings CTA NECK FINDINGS Aortic arch: There is calcified atherosclerotic plaque in the imaged aortic arch. The origins of the major branch vessels are patent. There is mixed plaque in the proximal left subclavian artery resulting in mild stenosis prior to the origin of the left vertebral artery. The subclavian arteries are otherwise patent to the level imaged. Right carotid system: The right common carotid artery is patent. There is calcified plaque at the bifurcation and proximal right internal carotid artery resulting in less than 50% stenosis. The distal right internal carotid artery is patent. The right external carotid artery is patent. There is no evidence of dissection or aneurysm. Left carotid system: The left common carotid artery is patent. There is predominantly calcified plaque in the proximal left internal carotid artery resulting in up to approximately 40-50% stenosis. The distal  left internal carotid artery is patent. The left external carotid artery is patent. There is no evidence of dissection or aneurysm. Vertebral arteries: There is severe stenosis at the origin of the left vertebral artery. The left vertebral artery is otherwise patent throughout the neck. There is moderate stenosis of the right V1 segment after the origin. The remainder of the right vertebral artery is patent. Skeleton: There is multilevel degenerative change of the cervical spine. There is no acute osseous abnormality or suspicious osseous lesion. There is no visible canal hematoma. Other neck: There is a 2.7 cm left thyroid nodule. The soft tissues of the neck are otherwise unremarkable. Upper chest: The imaged lung apices are clear. There is debris in the upper trachea. Review of the MIP images confirms the above findings CTA HEAD FINDINGS Anterior circulation: There is calcified plaque in the intracranial ICAs resulting in up to mild stenosis bilaterally. The left M1 segment is patent. There is occlusion of a superior left M2 branch in the sylvian fissure at the site of calcification seen on the initial noncontrast head  CT (5-40, 12-22). The right MCA is patent. The bilateral MCAs are patent. There is no aneurysm or AVM. Posterior circulation: The bilateral V4 segments are patent. PICA is identified bilaterally. The basilar artery is patent. The bilateral PCAs are patent. The posterior communicating arteries are not definitely seen. There is no aneurysm or AVM. Venous sinuses: Patent. Anatomic variants: None. Review of the MIP images confirms the above findings CT Brain Perfusion Findings: ASPECTS: 9 CBF (<30%) Volume: 49m Perfusion (Tmax>6.0s) volume: 369mMismatch Volume: 72m74mnfarction Location:No infarct core is identified by the perfusion software. The 3 cc penumbra is in the left MCA distribution. IMPRESSION: 1. 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 ASPECTS score equals 9. 2. No infarct core identified on the perfusion software. 3 cc ischemic penumbra is identified in the left MCA distribution, possibly underestimated given appearance on the noncontrast CT. 3. 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 4. Calcified plaque at the bilateral carotid bifurcations resulting in approximately 40-50% stenosis on the left and no hemodynamically significant stenosis on the right. 5. 2.7 cm left thyroid nodule. Recommend further evaluation with thyroid ultrasound as clinically indicated given patient's age, if not already performed elsewhere. 6. Debris in the trachea. Correlate with any signs or symptoms of aspiration. These results were called by telephone at the time of interpretation on 06/13/2021 at 4:40 pm to provider Dr AroRory Percyho verbally acknowledged these results. Electronically Signed   By: PetValetta MoleD.   On: 06/13/2021 17:02   ECHOCARDIOGRAM COMPLETE  Result Date: 06/14/2021    ECHOCARDIOGRAM REPORT   Patient Name:   Jay Howard of Exam: 06/14/2021 Medical Rec #:  [id number]  Height:       67.0 in Accession #:    [id number] Weight:       170.0 lb Date of Birth:  [birth year]  BSA:          1.887 m Patient Age:    [age]ars      BP:           154/79 mmHg Patient Gender: M             HR:           72 bpm. Exam Location:  Inpatient Procedure: 2D Echo, Cardiac Doppler, Color Doppler and Strain Analysis Indications:    Stroke  History:        Patient has prior history of Echocardiogram examinations, most                 recent 10/11/2020. Risk Factors:Hypertension. Cancer.  Sonographer:    DesJoette CatchingS Referring Phys: [id number] Haltonographer Comments: Global longitudinal strain was attempted. IMPRESSIONS  1. Left ventricular ejection fraction, by estimation, is 60 to 65%. The left ventricle has normal function. The left ventricle has no  regional wall motion abnormalities. Left ventricular diastolic parameters are consistent with Grade I diastolic dysfunction (impaired relaxation).  2. Right ventricular systolic function is normal. The right ventricular size is normal.  3. The mitral valve is normal in structure. Trivial mitral valve regurgitation. No evidence of mitral stenosis.  4. The aortic valve is tricuspid. Aortic valve regurgitation is not visualized. No aortic stenosis is present.  5. There is moderate dilatation of the ascending aorta, measuring 40 mm.  6. The inferior vena cava is normal in size with greater than 50% respiratory variability, suggesting right atrial pressure of 3 mmHg. FINDINGS  Left Ventricle: Global longitudinal strain performed today frame reviewed need to be imported to this study. Reviewed. Left ventricular ejection fraction, by estimation, is 60 to 65%. The left ventricle has normal function. The left ventricle has no regional wall motion abnormalities. Global longitudinal strain performed but not reported based on interpreter judgement due to suboptimal tracking. The left ventricular internal cavity size was normal in size. There is no left ventricular hypertrophy. Left ventricular diastolic parameters are consistent with Grade I diastolic dysfunction (impaired relaxation). Right Ventricle: The right ventricular size is normal. No increase in right ventricular wall thickness. Right ventricular systolic function is normal. Left Atrium: Left atrial size was normal in size. Right Atrium: Right atrial size was normal in size. Pericardium: There is no evidence of pericardial effusion. Mitral Valve: The mitral valve is normal in structure. Trivial mitral valve regurgitation. No evidence of mitral valve stenosis. Tricuspid Valve: The tricuspid valve is normal in structure. Tricuspid valve regurgitation is trivial. No evidence of tricuspid stenosis. Aortic Valve: The aortic valve is tricuspid. Aortic valve regurgitation is  not visualized. No aortic stenosis is present. Aortic valve mean gradient measures 5.0 mmHg. Aortic valve peak gradient measures 8.5 mmHg. Aortic valve area, by VTI measures 2.09 cm. Pulmonic Valve: The pulmonic valve was normal in structure. Pulmonic valve regurgitation is trivial. No evidence of pulmonic stenosis. Aorta: The aortic root is normal in size and structure. There is moderate dilatation of the ascending aorta, measuring 40 mm. Venous: The inferior vena cava is normal in size with greater than 50% respiratory variability, suggesting right atrial pressure of 3 mmHg. IAS/Shunts: No atrial level shunt detected by color flow Doppler.  LEFT VENTRICLE PLAX 2D LVIDd:         4.70 cm     Diastology LVIDs:         3.20 cm     LV e' medial:    6.42 cm/s LV PW:         1.10 cm     LV E/e' medial:  12.9 LV IVS:        1.20 cm     LV e' lateral:   9.57 cm/s LVOT diam:     2.00 cm     LV E/e' lateral: 8.7 LV SV:         70 LV SV Index:   37 LVOT Area:     3.14 cm  LV Volumes (MOD) LV vol d, MOD A2C: 66.1 ml LV vol d, MOD A4C: 79.0 ml LV vol s, MOD A2C: 25.5 ml LV vol s, MOD A4C: 27.7 ml LV SV MOD A2C:     40.6 ml LV SV MOD A4C:     79.0 ml LV SV MOD BP:      47.8 ml RIGHT VENTRICLE             IVC RV Basal diam:  2.80 cm     IVC diam: 1.50 cm RV Mid diam:    2.30 cm RV S prime:     10.70 cm/s TAPSE (M-mode): 1.6 cm LEFT ATRIUM             Index        RIGHT ATRIUM           Index LA diam:        5.10 cm 2.70 cm/m   RA  Area:     11.80 cm LA Vol (A2C):   44.5 ml 23.58 ml/m  RA Volume:   22.00 ml  11.66 ml/m LA Vol (A4C):   46.3 ml 24.53 ml/m LA Biplane Vol: 47.6 ml 25.22 ml/m  AORTIC VALVE                     PULMONIC VALVE AV Area (Vmax):    2.17 cm      PV Vmax:          1.00 m/s AV Area (Vmean):   2.13 cm      PV Peak grad:     4.0 mmHg AV Area (VTI):     2.09 cm      PR End Diast Vel: 6.15 msec AV Vmax:           146.00 cm/s AV Vmean:          103.000 cm/s AV VTI:            0.336 m AV Peak Grad:      8.5  mmHg AV Mean Grad:      5.0 mmHg LVOT Vmax:         101.00 cm/s LVOT Vmean:        69.800 cm/s LVOT VTI:          0.223 m LVOT/AV VTI ratio: 0.66  AORTA Ao Root diam: 3.10 cm Ao Asc diam:  4.00 cm MITRAL VALVE               TRICUSPID VALVE MV Area (PHT): 4.89 cm    TV Peak grad:   44.9 mmHg MV Decel Time: 155 msec    TV Vmax:        3.35 m/s MV E velocity: 83.10 cm/s  TR Peak grad:   34.1 mmHg MV A velocity: 98.50 cm/s  TR Vmax:        292.00 cm/s MV E/A ratio:  0.84                            SHUNTS                            Systemic VTI:  0.22 m                            Systemic Diam: 2.00 cm Kardie Tobb DO Electronically signed by Berniece Salines DO Signature Date/Time: 06/14/2021/6:25:36 PM    Final     Microbiology: Results for orders placed or performed during the hospital encounter of 06/13/21  Resp Panel by RT-PCR (Flu A&B, Covid) Urine, Clean Catch     Status: None   Collection Time: 06/13/21  4:17 PM   Specimen: Urine, Clean Catch; Nasal Swab  Result Value Ref Range Status   SARS Coronavirus 2 by RT PCR NEGATIVE NEGATIVE Final    Comment: (NOTE) SARS-CoV-2 target nucleic acids are NOT DETECTED.  The SARS-CoV-2 RNA is generally detectable in upper respiratory specimens during the acute phase of infection. The lowest concentration of SARS-CoV-2 viral copies this assay can detect is 138 copies/mL. A negative result does not preclude SARS-Cov-2 infection and should not be used as the sole basis for treatment or other patient management decisions. A negative result may occur with  improper specimen collection/handling, submission of specimen other than nasopharyngeal swab, presence of viral mutation(s)  within the areas targeted by this assay, and inadequate number of viral copies(<138 copies/mL). A negative result must be combined with clinical observations, patient history, and epidemiological information. The expected result is Negative.  Fact Sheet for Patients:   EntrepreneurPulse.com.au  Fact Sheet for Healthcare Providers:  IncredibleEmployment.be  This test is no t yet approved or cleared by the [place] FDA and  has been authorized for detection and/or diagnosis of SARS-CoV-2 by FDA under an Emergency Use Authorization (EUA). This EUA will remain  in effect (meaning this test can be used) for the duration of the COVID-19 declaration under Section 564(b)(1) of the Act, 21 U.S.C.section 360bbb-3(b)(1), unless the authorization is terminated  or revoked sooner.       Influenza A by PCR NEGATIVE NEGATIVE Final   Influenza B by PCR NEGATIVE NEGATIVE Final    Comment: (NOTE) The Xpert Xpress SARS-CoV-2/FLU/RSV plus assay is intended as an aid in the diagnosis of influenza from Nasopharyngeal swab specimens and should not be used as a sole basis for treatment. Nasal washings and aspirates are unacceptable for Xpert Xpress SARS-CoV-2/FLU/RSV testing.  Fact Sheet for Patients: EntrepreneurPulse.com.au  Fact Sheet for Healthcare Providers: IncredibleEmployment.be  This test is not yet approved or cleared by the [place] FDA and has been authorized for detection and/or diagnosis of SARS-CoV-2 by FDA under an Emergency Use Authorization (EUA). This EUA will remain in effect (meaning this test can be used) for the duration of the COVID-19 declaration under Section 564(b)(1) of the Act, 21 U.S.C. section 360bbb-3(b)(1), unless the authorization is terminated or revoked.  Performed at [facility], [place] [address] [place], [place] [address]     Labs: CBC: Recent Labs  Lab 06/13/21 1555 06/13/21 1628 06/15/21 0331  WBC 7.7  --  4.8  NEUTROABS 4.3  --   --   HGB 12.1* 12.2* 11.7*  HCT 37.4* 36.0* 34.8*  MCV 89.5  --  87.7  PLT 212  --  505   Basic Metabolic Panel: Recent Labs  Lab 06/13/21 1555 06/13/21 1628 06/15/21 0331  NA 135 136 140   K 4.2 4.1 3.6  CL 105 105 111  CO2 22  --  21*  GLUCOSE 108* 101* 90  BUN 20 24* 17  CREATININE 1.39* 1.30* 1.22  CALCIUM 8.6*  --  8.5*  MG  --   --  1.7   Liver Function Tests: Recent Labs  Lab 06/13/21 1555  AST 20  ALT 15  ALKPHOS 63  BILITOT 0.4  PROT 6.6  ALBUMIN 3.6   CBG: Recent Labs  Lab 06/13/21 1554  GLUCAP 106*    Discharge time spent: greater than 30 minutes.  Signed: Flora Lipps, MD [facility] 06/15/2021

## 2021-06-15 NOTE — Progress Notes (Signed)
Occupational Therapy Treatment Patient Details Name: Jay Howard MRN: [id number] DOB: Dec 01, 1940 Today's Date: 06/15/2021   History of present illness 81 y/o male presenting 5/31 with trouble speaking x 1 day.  MRI showing (1) numerous acute infarcts throughout the left MCA territory, including the left basal ganglia, insula, frontal, and parietal  lobes as well as (2) fewer small acute infarcts in bilateral parieto-occipital regions  and single punctate infarct in the right frontal lobe.  PMHx:  CA, HTN, seizures   OT comments  Pt progressing towards established OT goals. Continues to present with expressive deficits and decreased cognition. Pt able to verbalize more this session (~3 word long sentences) and performing better with calm environment. Pt unaware of bowel incontinence at bed level. Performing toileting with Min guard-Min A and hand hygiene with Min Guard A. Pt then performing one part trial making task with cues for locating directional signs. Continue to recommend dc to home with follow up at OP OT. Will continue to follow acutely as admitted.    Recommendations for follow up therapy are one component of a multi-disciplinary discharge planning process, led by the attending physician.  Recommendations may be updated based on patient status, additional functional criteria and insurance authorization.    Follow Up Recommendations  Outpatient OT    Assistance Recommended at Discharge Frequent or constant Supervision/Assistance  Patient can return home with the following      Equipment Recommendations  None recommended by OT    Recommendations for Other Services Speech consult    Precautions / Restrictions Precautions Precautions: None (lower fall risk) Restrictions Weight Bearing Restrictions: No       Mobility Bed Mobility Overal bed mobility: Needs Assistance Bed Mobility: Supine to Sit     Supine to sit: Min guard     General bed mobility comments: Min Guard A  for safety    Transfers Overall transfer level: Needs assistance Equipment used: None Transfers: Sit to/from Stand Sit to Stand: Min guard           General transfer comment: Min Guard A for safety     Balance Overall balance assessment: Needs assistance Sitting-balance support: No upper extremity supported, Feet supported Sitting balance-Leahy Scale: Good     Standing balance support: No upper extremity supported, During functional activity Standing balance-Leahy Scale: Good                             ADL either performed or assessed with clinical judgement   ADL Overall ADL's : Needs assistance/impaired     Grooming: Wash/dry hands;Min Dispensing optician: Min guard;Ambulation;Regular Museum/gallery exhibitions officer and Hygiene: Min guard;Sit to/from stand;Minimal assistance Toileting - Clothing Manipulation Details (indicate cue type and reason): Generally Min guard A for safety in standing. Min A to make sure he is clean.     Functional mobility during ADLs: Min guard General ADL Comments: Performing toileting and hand hygiene after bowel incontience in bed. Pt then participating in trail making task to further assess and challenge problem solving and sequencing.    Extremity/Trunk Assessment Upper Extremity Assessment Upper Extremity Assessment: Overall WFL for tasks assessed   Lower Extremity Assessment Lower Extremity Assessment: Defer to PT evaluation        Vision       Perception     Praxis  Cognition Arousal/Alertness: Awake/alert Behavior During Therapy: WFL for tasks assessed/performed, Flat affect (Generally flat; smiling more often this session) Overall Cognitive Status: Difficult to assess Area of Impairment: Problem solving, Awareness, Safety/judgement, Following commands                       Following Commands: Follows one step commands consistently, Follows  multi-step commands inconsistently Safety/Judgement: Decreased awareness of deficits Awareness: Emergent Problem Solving: Slow processing, Requires verbal cues General Comments: Upon arrival, pt unaware of slight bowel incontenience. Able to perform toileting and hand hygiene at sink. Pt participating in two part trail making task. Requiring cues to locate directional signs to orient self. Once pt noting directional signs, pt able to problem solve and locate "3w20". Discussing how environment impact pt's processing and pt demonstrating awareness to say that more noise makes it harder for him to think. Pt able to state ~3 word sentences with calm environment.        Exercises      Shoulder Instructions       General Comments      Pertinent Vitals/ Pain       Pain Assessment Pain Assessment: Faces Faces Pain Scale: No hurt Pain Intervention(s): Monitored during session  Home Living                                          Prior Functioning/Environment              Frequency  Min 3X/week        Progress Toward Goals  OT Goals(current goals can now be found in the care plan section)  Progress towards OT goals: Progressing toward goals  Acute Rehab OT Goals OT Goal Formulation: With patient Time For Goal Achievement: 06/28/21 Potential to Achieve Goals: Good ADL Goals Additional ADL Goal #1: Pt will recall 3/3 grooming tasks to perform at sink with Supervision Additional ADL Goal #2: Pt will perform three part trail making task with Min cues Additional ADL Goal #3: Pt will perform simple IADL task with Min cues  Plan Discharge plan remains appropriate    Co-evaluation                 AM-PAC OT "6 Clicks" Daily Activity     Outcome Measure   Help from another person eating meals?: A Little Help from another person taking care of personal grooming?: A Little Help from another person toileting, which includes using toliet, bedpan, or  urinal?: A Little Help from another person bathing (including washing, rinsing, drying)?: A Little Help from another person to put on and taking off regular upper body clothing?: A Little Help from another person to put on and taking off regular lower body clothing?: A Little 6 Click Score: 18    End of Session Equipment Utilized During Treatment: Gait belt  OT Visit Diagnosis: Unsteadiness on feet (R26.81);Other abnormalities of gait and mobility (R26.89);Muscle weakness (generalized) (M62.81);Other symptoms and signs involving cognitive function   Activity Tolerance Patient tolerated treatment well   Patient Left in chair;with call bell/phone within reach;with chair alarm set   Nurse Communication Mobility status        Time: 6387-5643 OT Time Calculation (min): 25 min  Charges: OT General Charges $OT Visit: 1 Visit OT Treatments $Self Care/Home Management : 8-22 mins $Cognitive Funtion inital: Initial 15 mins  [place],  OTR/L Acute Rehab Pager: [phone] Office: [place] 06/15/2021, 2:05 PM

## 2021-06-15 NOTE — Progress Notes (Signed)
STROKE TEAM PROGRESS NOTE   INTERVAL HISTORY No family is at the bedside. Speech improved from yesterday but still has mild expressive aphasia. Pt just walked with PT in the hallway and no PT follow up recommended. He will still need speech therapy as outpt.   Vitals:   06/15/21 0430 06/15/21 0756 06/15/21 0759 06/15/21 1148  BP: (!) 178/88 (!) 196/70 (!) 171/85 (!) 178/72  Pulse: 70 66 71 69  Resp:  18  20  Temp:  98 F (36.7 C)  98.1 F (36.7 C)  TempSrc:  Oral  Oral  SpO2:  98% 98% 100%  Weight:      Height:       CBC:  Recent Labs  Lab 06/13/21 1555 06/13/21 1628 06/15/21 0331  WBC 7.7  --  4.8  NEUTROABS 4.3  --   --   HGB 12.1* 12.2* 11.7*  HCT 37.4* 36.0* 34.8*  MCV 89.5  --  87.7  PLT 212  --  161   Basic Metabolic Panel:  Recent Labs  Lab 06/13/21 1555 06/13/21 1628 06/15/21 0331  NA 135 136 140  K 4.2 4.1 3.6  CL 105 105 111  CO2 22  --  21*  GLUCOSE 108* 101* 90  BUN 20 24* 17  CREATININE 1.39* 1.30* 1.22  CALCIUM 8.6*  --  8.5*  MG  --   --  1.7   Lipid Panel:  Recent Labs  Lab 06/14/21 0540  CHOL 118  TRIG 92  HDL 35*  CHOLHDL 3.4  VLDL 18  LDLCALC 65   HgbA1c:  Recent Labs  Lab 06/13/21 2007  HGBA1C 5.9*   Urine Drug Screen:  Recent Labs  Lab 06/13/21 1657  LABOPIA NONE DETECTED  COCAINSCRNUR NONE DETECTED  LABBENZ NONE DETECTED  AMPHETMU NONE DETECTED  THCU NONE DETECTED  LABBARB NONE DETECTED    Alcohol Level No results for input(s): ETH in the last 168 hours.  IMAGING past 24 hours MR BRAIN WO CONTRAST  Result Date: 06/14/2021 CLINICAL DATA:  Stroke, follow up EXAM: MRI HEAD WITHOUT CONTRAST TECHNIQUE: Multiplanar, multiecho pulse sequences of the brain and surrounding structures were obtained without intravenous contrast. COMPARISON:  Same day CT and CTA head. 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 The largest/most  confluent infarct is in the high left frontal lobe. Fewer small acute infarcts in bilateral parieto-occipital regionsand single punctate infarct in the right frontal lobe. Areas of associated edema without midline shift. No evidence of acute hemorrhage, hydrocephalus, or extra-axial fluid collection. Chronic microvascular ischemic disease. Vascular: Better assessed on same day CTA. The major arterial flow voids at the skull base are maintained. Skull and upper cervical spine: Normal marrow signal. Sinuses/Orbits: Minimal paranasal sinus mucosal thickening. No acute orbital findings. Other: Trace bilateral mastoid effusions. IMPRESSION: 1. 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 The largest/most confluent infarct is in the high left frontal lobe. 2. Fewer small acute infarcts in bilateral parieto-occipital regions and single punctate infarct in the right frontal lobe. 3. Areas of associated edema without midline shift. Electronically Signed   By: Margaretha Sheffield M.D.   On: 06/14/2021 18:18    PHYSICAL EXAM  Constitutional: Appears well-developed and well-nourished.  Cardiovascular: Normal rate and regular rhythm.  Respiratory: Effort normal, non-labored breathing  Neuro - awake, alert, eyes open, able to name and repeat, but still  has difficulty with reading, more fluent language than yesterday but still has wording finding difficulty and paraphasic errors intermittently, able to follow all simple commands. No gaze palsy, tracking bilaterally, blinking to visual threat bilaterally. Slight right facial droop on facial movement. Tongue midline. Bilateral UEs 5/5, no drift. Bilaterally LEs 5/5, no drift. Sensation symmetrical bilaterally, b/l FTN intact, gait not tested.    ASSESSMENT/PLAN [name] is a 81 y.o. male with history of hypertension, seizure on Keppra, hyperlipidemia presenting with confusion,  aphasia, apraxia.  TNK not given due to outside window.  Stroke:  left MCA and ACA patchy and small infarcts as well as several right ACA/MCA infarcts, still consistent with source from left ICA atherosclerosis with calcified and , large vessel disease source Code Stroke CT head left frontal cortical infarct, ASPECTS score 9. CTA head & neck Occluded left superior M2 branch due to a calcific embolus with corresponding acute infarct in the MCA distribution. 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 The largest/most confluent infarct is in the high left frontal lobe. Fewer small acute infarcts in bilateral parieto-occipital regions and single punctate infarct in the right frontal lobe. 2D Echo EF 60-65% LDL 65 HgbA1c 5.9 UDS negative VTE prophylaxis - lovenox '40mg'$  No antithrombotic prior to admission, now on aspirin 325 mg daily and clopidogrel 75 mg daily for 3 months and then as alone given intracranial stenosis.  Therapy recommendations: Outpatient OT and speech Disposition: Pending  Carotid stenosis CTA head and neck Calcified plaque at the bilateral carotid bifurcations resulting in approximately 40-50% stenosis on the left  Outpt follow up with [facility] No intervention needed at this time.  Hx of seizure Since 2017 Infrequent but with GTC Last seizure several months ago No driving now pre wife On keppra  Hypertension Home meds:  Coreg 12.'5mg'$  daily, losartan '100mg'$  daily, spironolactone '25mg'$  Stable gradually normalize in 2-3 days Long-term BP goal normotensive  Hyperlipidemia Home meds:  Atorvastatin '5mg'$ , increased to '10mg'$  daily LDL 65, goal < 70 Continue statin at discharge  Other Stroke Risk Factors Advanced Age >/= 61   Other Active Problems AKI - Cre  1.30->1.22  Hospital day # 1  Neurology will sign off. Please call with questions. Pt will follow up with stroke clinic NP at [facility] in about 4 weeks. Thanks for the consult.   Rosalin Hawking, MD PhD Stroke Neurology 06/15/2021 1:43 PM   To contact Stroke Continuity provider, please refer to [email or web address] After hours, contact General Neurology

## 2021-06-15 NOTE — TOC Progression Note (Signed)
Transition of Care [facility]) - Progression Note    Patient Details  Name: Jay Howard MRN: [id number] Date of Birth: July 02, 1940  Transition of Care [facility]) CM/SW Contact  Loletha Grayer Beverely Pace, RN Phone Number: 06/15/2021, 3:35 PM  Clinical Narrative:  Case Manager spoke with  patient's wife concerning recommendation for Home Health therapy. [name] states they have used [facility] [place] and she would like to have them now. CM called [place], [facility] Liaison with referral. Patient will discharge home today.     Expected Discharge Plan: [place] Barriers to Discharge: No Barriers Identified  Expected Discharge Plan and Services Expected Discharge Plan: [place]   Discharge Planning Services: NA Post Acute Care Choice: [place] arrangements for the past 2 months: Single Family Home Expected Discharge Date: 06/15/21               DME Arranged: N/A DME Agency: NA       HH Arranged: OT, PT HH Agency: [place] Date [place]: 06/15/21 Time [place]: [id number] Representative spoke with at [place]: [place]   Social Determinants of Health ([place]) Interventions    Readmission Risk Interventions     View : No data to display.

## 2021-06-15 NOTE — Plan of Care (Signed)
Patient reported slurred speech upon arrival at ED, MRI/CT confirmed Acute Ischemic [place]/TIA with Expressive Aphasia as residual. Has been active in care plan and has participated in therapy sessions.  All personal belongings at bedside returned and packed in bag.  Teaching provided with patient/wife about DME use and outpatient PT/OT plans,  both verbalized understanding. Heart monitoring disconnected/stopped.  IV sites at left [facility] and left forearm discontinued with both tips intact, no s/s of infection and guaze dressings in place.  Discharged to home via personal care with wife at his side.

## 2021-06-15 NOTE — Progress Notes (Signed)
Speech Language Pathology Treatment: Cognitive-Linquistic  Patient Details Name: Jay Howard MRN: [id number] DOB: December 23, 1940 Today's Date: 06/15/2021 Time: 9166-0600 SLP Time Calculation (min) (ACUTE ONLY): 11 min  Assessment / Plan / Recommendation Clinical Impression  Pt was seen for f/u tx for his aphasia, demonstrating improvements since initial evaluation on previous date. Today he could state his name independently and answered orientation questions with Min cues for word-finding. He was 100% accurate with simple yes/no questions after self-corrections, but then less than 50% accurate with more complex yes/no questions. Attempted writing with him but handwriting was not legible. Pt did appear to write his first and last name but when asked to write his address, started writing something before saying "I can't" and not finishing it. Pt read instructions for simple, one-step commands out loud but needed cues to break perseverative responses. He will still benefit from ongoing SLP f/u after discharge.    HPI HPI: Pt is an 81 y/o male presenting 5/31 with trouble speaking x 1 day.  CT showing L MCA infarct with superior M2 branch occluded.  PMHx:  CA, HTN, seizures      SLP Plan  Continue with current plan of care      Recommendations for follow up therapy are one component of a multi-disciplinary discharge planning process, led by the attending physician.  Recommendations may be updated based on patient status, additional functional criteria and insurance authorization.    Recommendations                   Follow Up Recommendations: Outpatient SLP Assistance recommended at discharge: Frequent or constant Supervision/Assistance SLP Visit Diagnosis: Aphasia (R47.01) Plan: Continue with current plan of care           Osie Bond., M.A. [place] Office [phone]  Secure chat preferred   06/15/2021, 2:23 PM

## 2021-07-30 NOTE — Progress Notes (Signed)
[facility] [address] [place]. [place] [address] [phone]       HOSPITAL FOLLOW UP NOTE  [name] Date of Birth:  1940-04-21 Medical Record Number:  [id number]   Reason for Referral:  hospital stroke follow up    SUBJECTIVE:   CHIEF COMPLAINT:  No chief complaint on file.   HPI:   [name] is a 81 y.o. who  has a past medical history of Cancer ([place]), H/O hemorrhoidectomy, H/O prostatectomy, Hypertension, and Seizures ([place]).  Patient presented on 06/13/2021 with difficulty forming sentences and slurred speech for 1 day. BP was elevated in the ER to > 474 systolic. CT showed left frontal infarct. CTA showed occluded left superior M2 branch due to calcified embolus. He was started on asa '325mg'$  and Plavix for 3 months then asa alone. PT/OT and ST recommended outpatient therapy. Personally reviewed hospitalization pertinent progress notes, lab work and imaging.  Evaluated by Erlinda Hong.   Since discharge,   He is followed by [facility] Neurology, Dr Doy Hutching, for seizure disorder on levetiracetam '750mg'$  BID.    PERTINENT IMAGING/LABS  Code Stroke CT head left frontal cortical infarct, ASPECTS score 9. CTA head & neck Occluded left superior M2 branch due to a calcific embolus with corresponding acute infarct in the MCA distribution. 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 The largest/most confluent infarct is in the high left frontal lobe. Fewer small acute infarcts in bilateral parieto-occipital regions and single punctate infarct in the right frontal lobe. 2D Echo EF 60-65%   TEE   A1C Lab Results  Component Value Date   HGBA1C 5.9 (H) 06/13/2021    Lipid Panel     Component Value Date/Time   CHOL 118 06/14/2021 0540   TRIG 92  06/14/2021 0540   HDL 35 (L) 06/14/2021 0540   CHOLHDL 3.4 06/14/2021 0540   VLDL 18 06/14/2021 0540   LDLCALC 65 06/14/2021 0540      ROS:   14 system review of systems performed and negative with exception of those listed in HPI  PMH:  Past Medical History:  Diagnosis Date   Cancer ([place])    H/O hemorrhoidectomy    H/O prostatectomy    Hypertension    Seizures ([place])     PSH: No past surgical history on file.  Social History:  Social History   Socioeconomic History   Marital status: Married    Spouse name: Not on file   Number of children: Not on file   Years of education: Not on file   Highest education level: Not on file  Occupational History   Not on file  Tobacco Use   Smoking status: Never   Smokeless tobacco: Never  Vaping Use   Vaping Use: Never used  Substance and Sexual Activity   Alcohol use: Yes    Comment: vodka   Drug use: Never   Sexual activity: Not on file  Other Topics Concern   Not on file  Social History Narrative   Not on file   Social Determinants of Health   Financial Resource Strain: Not on file  Food Insecurity: Not on file  Transportation Needs: Not on file  Physical Activity: Not on file  Stress: Not on file  Social Connections: Not on file  Intimate Partner Violence: Not on file  Family History: No family history on file.  Medications:   Current Outpatient Medications on File Prior to Visit  Medication Sig Dispense Refill   acetaminophen (TYLENOL) 500 MG tablet Take 500 mg by mouth every 6 (six) hours as needed for mild pain.     aspirin EC 325 MG tablet Take 1 tablet (325 mg total) by mouth daily. 90 tablet 3   atorvastatin (LIPITOR) 10 MG tablet Take 1 tablet (10 mg total) by mouth daily. 30 tablet 2   carvedilol (COREG) 12.5 MG tablet Take 12.5 mg by mouth 2 (two) times daily with a meal.     Cholecalciferol (VITAMIN D3) 25 MCG (1000 UT) CAPS Take 1 capsule by mouth daily.     clopidogrel (PLAVIX) 75 MG tablet Take  1 tablet (75 mg total) by mouth daily. 90 tablet 0   hydrALAZINE (APRESOLINE) 25 MG tablet Take 25 mg by mouth in the morning and at bedtime.     levETIRAcetam (KEPPRA) 750 MG tablet Take 750 mg by mouth 2 (two) times daily.     losartan (COZAAR) 100 MG tablet Take 100 mg by mouth daily.     magnesium oxide (MAG-OX) 400 MG tablet Take 400 mg by mouth daily.     pantoprazole (PROTONIX) 40 MG tablet Take 40 mg by mouth daily.     spironolactone (ALDACTONE) 25 MG tablet Take 25 mg by mouth daily.     thiamine (VITAMIN B-1) 100 MG tablet Take 100 mg by mouth daily.     No current facility-administered medications on file prior to visit.    Allergies:   Allergies  Allergen Reactions   Amlodipine Rash and Swelling   Hydrochlorothiazide Rash   Lisinopril Rash   Quinapril Rash      OBJECTIVE:  Physical Exam  There were no vitals filed for this visit. There is no height or weight on file to calculate BMI. No results found.      No data to display           General: well developed, well nourished, seated, in no evident distress Head: head normocephalic and atraumatic.   Neck: supple with no carotid or supraclavicular bruits Cardiovascular: regular rate and rhythm, no murmurs Musculoskeletal: no deformity Skin:  no rash/petichiae Vascular:  Normal pulses all extremities   Neurologic Exam Mental Status: Awake and fully alert.  Fluent speech and language.  Oriented to place and time. Recent and remote memory intact. Attention span, concentration and fund of knowledge appropriate. Mood and affect appropriate.  Cranial Nerves: Fundoscopic exam reveals sharp disc margins. Pupils equal, briskly reactive to light. Extraocular movements full without nystagmus. Visual fields full to confrontation. Hearing intact. Facial sensation intact. Face, tongue, palate moves normally and symmetrically.  Motor: Normal bulk and tone. Normal strength in all tested extremity muscles Sensory.: intact  to touch , pinprick , position and vibratory sensation.  Coordination: Rapid alternating movements normal in all extremities. Finger-to-nose and heel-to-shin performed accurately bilaterally. Gait and Station: Arises from chair without difficulty. Stance is normal. Gait demonstrates normal stride length and balance with ***. Tandem walk and heel toe ***.  Reflexes: 1+ and symmetric.    NIHSS  *** Modified [place]  ***    ASSESSMENT: Jay Howard is a 81 y.o. year old male presenting to the hospital 06/13/2021 with difficulty forming sentences and slurred speech for 1 day. Vascular risk factors include HTN, HLD, and advanced age.   PLAN:  Stroke:  left MCA and ACA patchy and small  infarcts as well as several right ACA/MCA infarcts, still consistent with source from left ICA atherosclerosis with calcified and , large vessel disease source : Residual deficit: ***. Continue aspirin 325 mg daily and clopidogrel 75 mg daily until 09/14/2021 then asa '325mg'$  daily and atorvastatin '5mg'$  daily for secondary stroke prevention.  Discussed secondary stroke prevention measures and importance of close PCP follow up for aggressive stroke risk factor management. I have gone over the pathophysiology of stroke, warning signs and symptoms, risk factors and their management in some detail with instructions to go to the closest emergency room for symptoms of concern. HTN: BP goal <130/90.  Stable on Coreg 12.'5mg'$ , losartan '100mg'$  and spironolactone '25mg'$  daily per PCP HLD: LDL goal <70. Recent LDL 65. Continue atorvastatin '5mg'$  daily per PCP.    Follow up in *** or call earlier if needed   CC:  [facility] provider: Dr. Leonie Man PCP: Burman Freestone, MD    I spent 45 minutes of face-to-face and non-face-to-face time with patient.  This included previsit chart review including review of recent hospitalization, lab review, study review, order entry, electronic health record documentation, patient education regarding recent stroke  including etiology, secondary stroke prevention measures and importance of managing stroke risk factors, residual deficits and typical recovery time and answered all other questions to patient satisfaction   Debbora Presto, [facility]  [facility] [address] [place], [place] [address]  Phone [phone] Fax [phone] Note: This document was prepared with digital dictation and possible smart phrase technology. Any transcriptional errors that result from this process are unintentional.

## 2021-07-30 NOTE — Patient Instructions (Signed)
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 Continue aspirin 325 mg daily and clopidogrel 75 mg daily until 09/14/2021 then asa '325mg'$  daily and atorvastatin '5mg'$  daily for secondary stroke prevention.  Discussed secondary stroke prevention measures and importance of close PCP follow up for aggressive stroke risk factor management. I have gone over the pathophysiology of stroke, warning signs and symptoms, risk factors and their management in some detail with instructions to go to the closest emergency room for symptoms of concern. HTN: BP goal <130/90.  Stable on Coreg 12.'5mg'$ , losartan '100mg'$  and spironolactone '25mg'$  daily per PCP HLD: LDL goal <70. Recent LDL 65. Continue atorvastatin '5mg'$  daily per PCP.  Memory loss: continue working with ST, use memory compensation strategies as discussed.  Aphasia: continue working ST  Please make sure you are staying well hydrated. I recommend 50-60 ounces daily. Well balanced diet and regular exercise encouraged. Consistent sleep schedule with 6-8 hours recommended.   Please continue follow up with care team as directed.   Follow up with me in 6 months   Management of Memory Problems   There are some general things you can do to help manage your memory problems.  Your memory may not in fact recover, but by using techniques and strategies you will be able to manage your memory difficulties better.   1)  Establish a routine. Try to establish and then stick to a regular routine.  By doing this, you will get used to what to expect and you will reduce the need to rely on your memory.  Also, try to do things at the same time of day, such as taking your medication or checking your calendar first thing in the morning. Think about think that you can do as a part  of a regular routine and make a list.  Then enter them into a daily planner to remind you.  This will help you establish a routine.   2)  Organize your environment. Organize your environment so that it is uncluttered.  Decrease visual stimulation.  Place everyday items such as keys or cell phone in the same place every day (ie.  Basket next to front door) Use post it notes with a brief message to yourself (ie. Turn off light, lock the door) Use labels to indicate where things go (ie. Which cupboards are for food, dishes, etc.) Keep a notepad and pen by the telephone to take messages   3)  Memory Aids A diary or journal/notebook/daily planner Making a list (shopping list, chore list, to do list that needs to be done) Using an alarm as a reminder (kitchen timer or cell phone alarm) Using cell phone to store information (Notes, Calendar, Reminders) Calendar/White board placed in a prominent position Post-it notes   In order for memory aids to be useful, you need to have good habits.  It's no good remembering to make a note in your journal if you don't remember to look in it.  Try setting aside a certain time of day to look in journal.   4)  Improving mood and managing fatigue. There may be other factors that contribute to memory difficulties.  Factors, such as anxiety, depression and tiredness can affect memory. Regular gentle exercise can help improve your mood and give you more energy. Simple relaxation  techniques may help relieve symptoms of anxiety Try to get back to completing activities or hobbies you enjoyed doing in the past. Learn to pace yourself through activities to decrease fatigue. Find out about some local support groups where you can share experiences with others. Try and achieve 7-8 hours of sleep at night.   You may receive a survey regarding today's visit. I encourage you to leave honest feed back as I do use this information to improve patient care. Thank you for seeing  me today!

## 2021-07-31 ENCOUNTER — Ambulatory Visit: Payer: Medicare Other | Admitting: Family Medicine

## 2021-07-31 ENCOUNTER — Encounter: Payer: Self-pay | Admitting: Family Medicine

## 2021-07-31 VITALS — BP 149/65 | HR 69 | Ht 64.0 in | Wt 177.0 lb

## 2021-07-31 DIAGNOSIS — I639 Cerebral infarction, unspecified: Secondary | ICD-10-CM

## 2021-07-31 NOTE — Progress Notes (Signed)
I agree with the above plan 

## 2021-08-23 ENCOUNTER — Telehealth: Payer: Self-pay | Admitting: Family Medicine

## 2021-08-23 NOTE — Telephone Encounter (Signed)
LVM informing pt of need to reschedule 1/23 appointment - Amy out

## 2022-02-05 ENCOUNTER — Ambulatory Visit: Payer: Medicare Other | Admitting: Family Medicine

## 2023-08-26 IMAGING — MR MR HEAD W/O CM
9 of 10 series · 38 of 48 positions shown · non-contrast
Comparison: Same day CT and CTA head.

CLINICAL DATA: Stroke, follow up

EXAM:
MRI HEAD WITHOUT CONTRAST
TECHNIQUE: Multiplanar, multiecho pulse sequences of the brain and surrounding
structures were obtained without intravenous contrast.

[Series 4: DWI · axial · 3.0mm · 1.09mm/px · z∈[-54,+111]mm · 11 of 112 slices shown (1 of 4)]
[im 1/112]
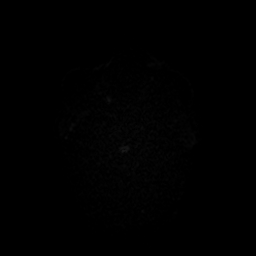
[im 12/112]
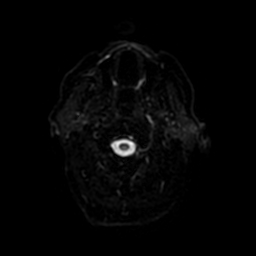
[im 23/112]
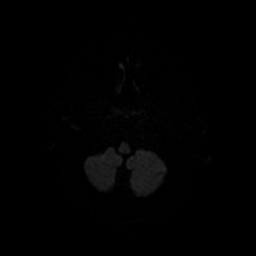
[im 34/112]
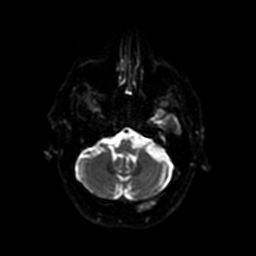
[im 45/112]
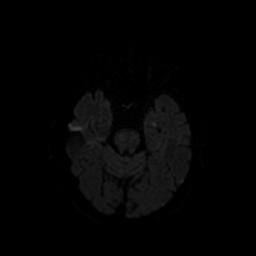
[im 56/112]
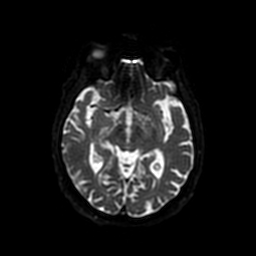
[im 67/112]
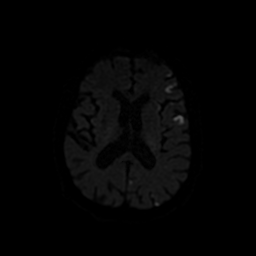
[im 78/112]
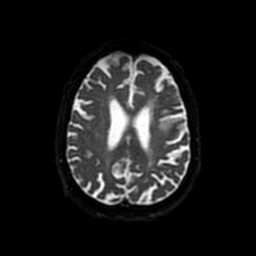
[im 89/112]
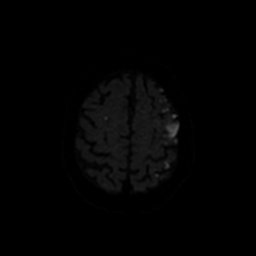
[im 100/112]
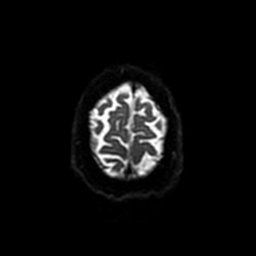
[im 112/112]
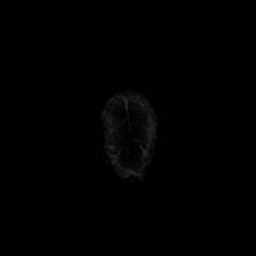

[Series 5: DWI · coronal · 5.0mm · 1.09mm/px · 7 of 82 slices shown (2 of 4)]
[im 1/82]
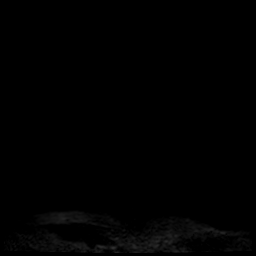
[im 14/82]
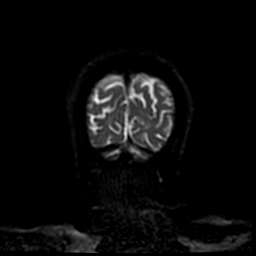
[im 28/82]
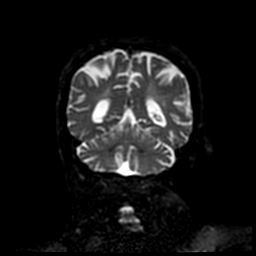
[im 41/82]
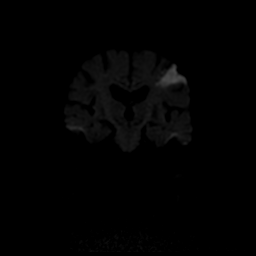
[im 55/82]
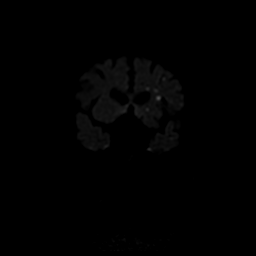
[im 68/82]
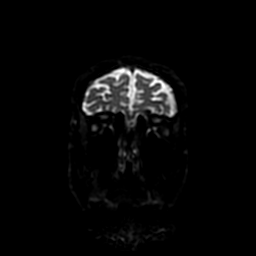
[im 82/82]
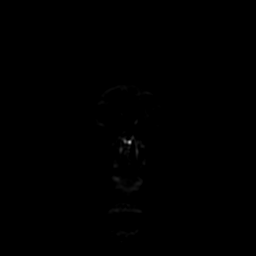

[Series 6: T1 · sagittal · 5.0mm · 0.49mm/px · 2 of 27 slices shown (1 of 2)]
[im 1/27]
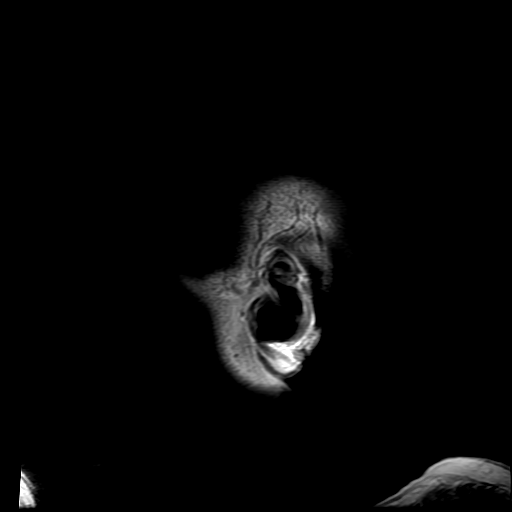
[im 27/27]
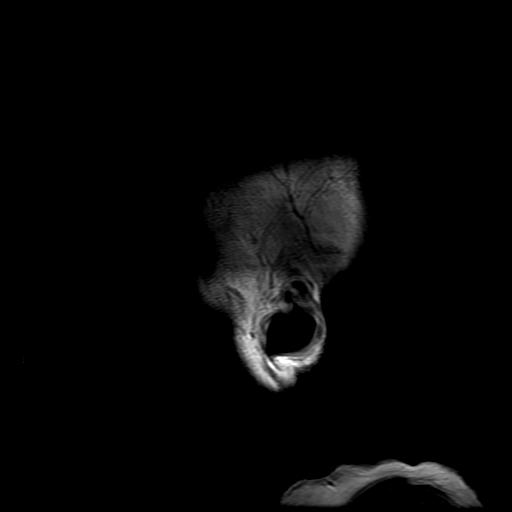

[Series 7: T2 · axial · 5.0mm · 0.45mm/px · z∈[-42,+108]mm · 2 of 26 slices shown (1 of 2)]
[im 1/26]
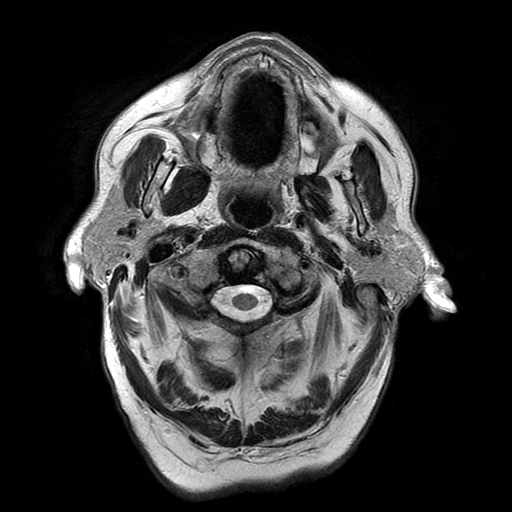
[im 26/26]
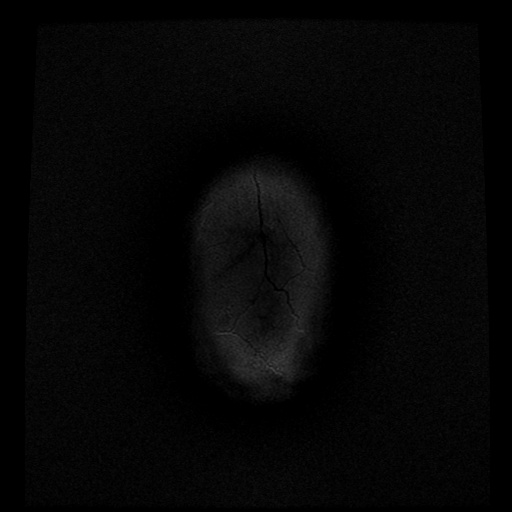

[Series 8: FLAIR · axial · 3.0mm · 0.45mm/px · z∈[-54,+108]mm · 3 of 28 slices shown]
[im 1/28]
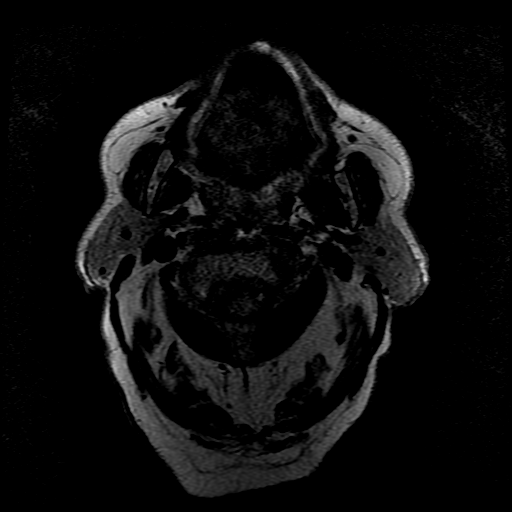
[im 14/28]
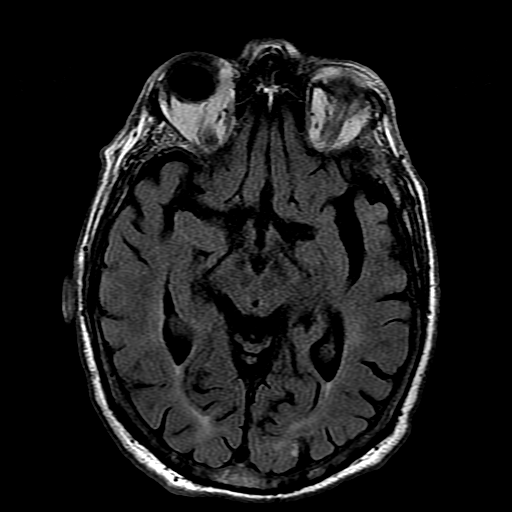
[im 28/28]
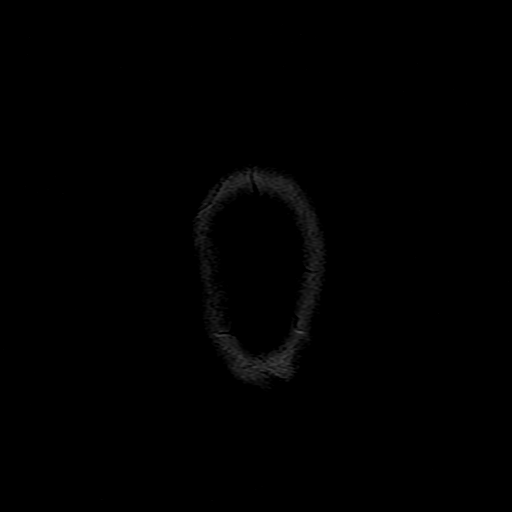

[Series 10: T1 · axial · 3.0mm · 0.47mm/px · 1 of 104 slices shown (2 of 2)]
[im 1/104]
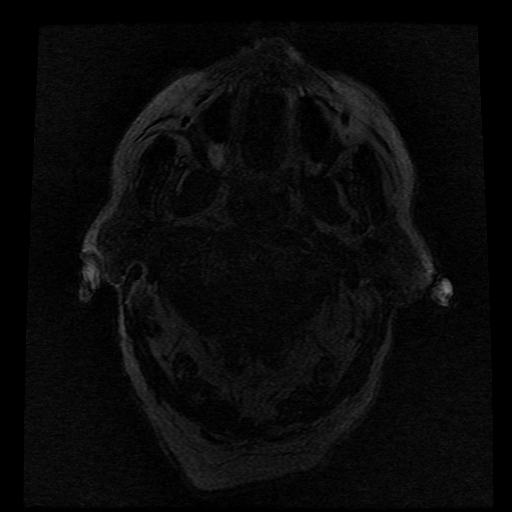

[Series 11: T2 · coronal · 5.0mm · 0.41mm/px · 3 of 32 slices shown (2 of 2)]
[im 1/32]
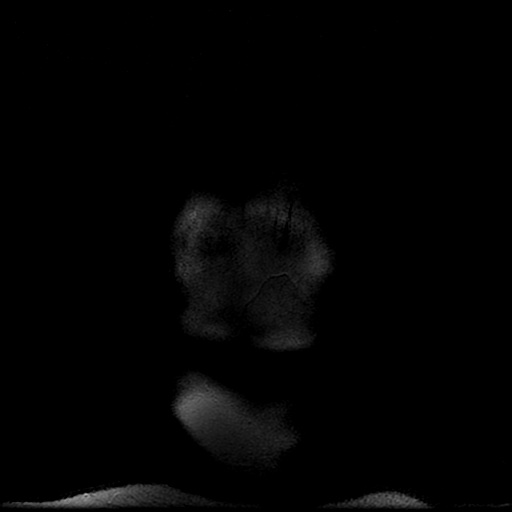
[im 16/32]
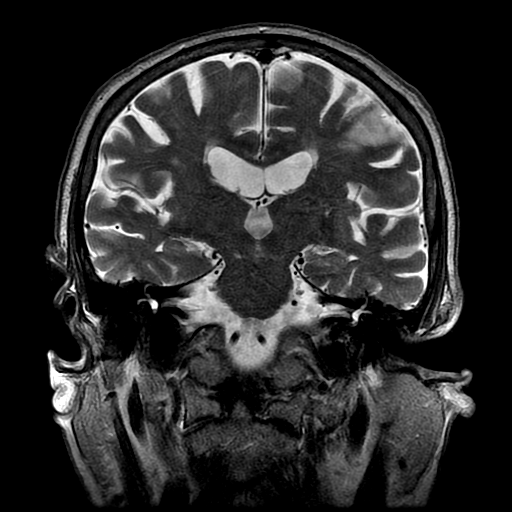
[im 32/32]
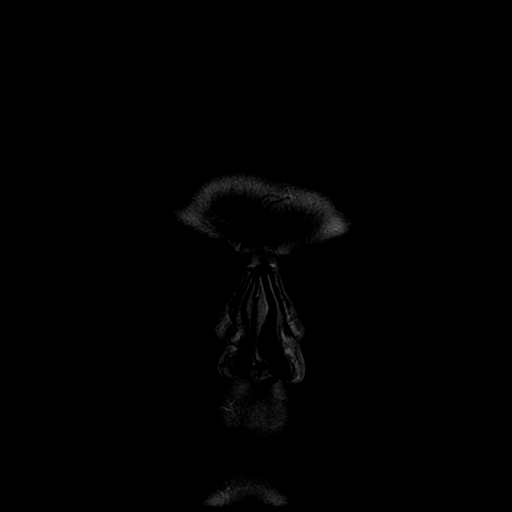

[Series 400: DWI · axial · 3.0mm · 1.09mm/px · z∈[-54,+111]mm · 5 of 56 slices shown (3 of 4)]
[im 1/56]
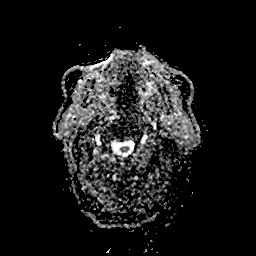
[im 14/56]
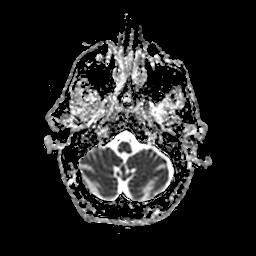
[im 28/56]
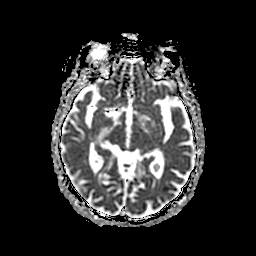
[im 42/56]
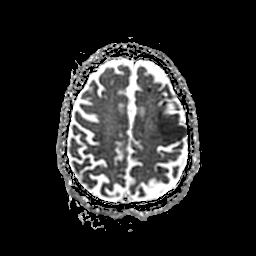
[im 56/56]
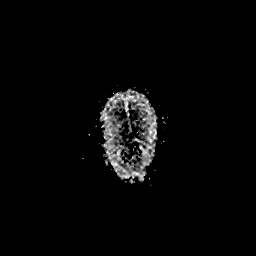

[Series 500: DWI · coronal · 5.0mm · 1.09mm/px · 4 of 40 slices shown (4 of 4)]
[im 1/40]
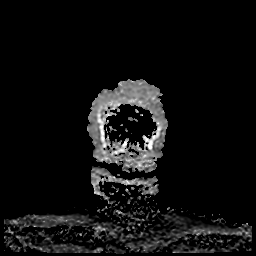
[im 14/40]
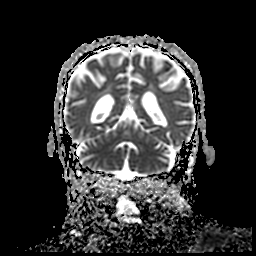
[im 27/40]
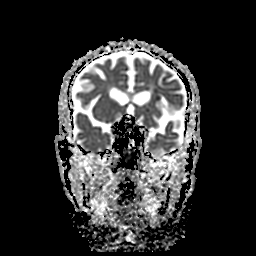
[im 40/40]
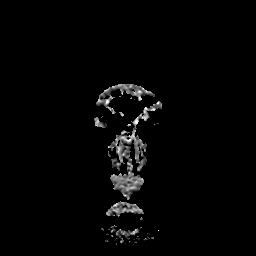

[38 of 48 positions shown; findings below may reference images not displayed]

FINDINGS: Brain: Numerous acute infarcts throughout the left MCA territory,
including the left basal ganglia, insula, frontal, and parietal
lobes. The largest/most confluent infarct is in the high left
frontal lobe. Fewer small acute infarcts in bilateral
parieto-occipital regionsand single punctate infarct in the right
frontal lobe. Areas of associated edema without midline shift. No
evidence of acute hemorrhage, hydrocephalus, or extra-axial fluid
collection. Chronic microvascular ischemic disease.

Vascular: Better assessed on same day CTA. The major arterial flow
voids at the skull base are maintained.

Skull and upper cervical spine: Normal marrow signal.

Sinuses/Orbits: Minimal paranasal sinus mucosal thickening. No acute
orbital findings.

Other: Trace bilateral mastoid effusions.
IMPRESSION: 1. Numerous acute infarcts throughout the left MCA territory,
including the left basal ganglia, insula, frontal, and parietal
lobes. The largest/most confluent infarct is in the high left
frontal lobe.
2. Fewer small acute infarcts in bilateral parieto-occipital regions
and single punctate infarct in the right frontal lobe.
3. Areas of associated edema without midline shift.
